# Patient Record
Sex: Female | Born: 1975
Health system: Southern US, Community
[De-identification: ages and names within clinical notes are randomized; demographics above are authoritative.]

## PROBLEM LIST (undated history)

## (undated) DIAGNOSIS — L219 Seborrheic dermatitis, unspecified: Secondary | ICD-10-CM

## (undated) DIAGNOSIS — N6310 Unspecified lump in the right breast, unspecified quadrant: Secondary | ICD-10-CM

## (undated) DIAGNOSIS — I10 Essential (primary) hypertension: Secondary | ICD-10-CM

## (undated) DIAGNOSIS — K219 Gastro-esophageal reflux disease without esophagitis: Secondary | ICD-10-CM

## (undated) DIAGNOSIS — F988 Other specified behavioral and emotional disorders with onset usually occurring in childhood and adolescence: Secondary | ICD-10-CM

## (undated) DIAGNOSIS — E559 Vitamin D deficiency, unspecified: Secondary | ICD-10-CM

## (undated) HISTORY — DX: Gastro-esophageal reflux disease without esophagitis: K21.9

## (undated) HISTORY — DX: Seborrheic dermatitis, unspecified: L21.9

## (undated) HISTORY — PX: TONSILLECTOMY: SUR1361

## (undated) HISTORY — DX: Vitamin D deficiency, unspecified: E55.9

## (undated) HISTORY — DX: Essential (primary) hypertension: I10

## (undated) HISTORY — DX: Other specified behavioral and emotional disorders with onset usually occurring in childhood and adolescence: F98.8

---

## 1998-06-09 ENCOUNTER — Other Ambulatory Visit: Admission: RE | Admit: 1998-06-09 | Discharge: 1998-06-09 | Payer: Self-pay | Admitting: Obstetrics and Gynecology

## 1998-10-21 ENCOUNTER — Emergency Department (HOSPITAL_COMMUNITY): Admission: EM | Admit: 1998-10-21 | Discharge: 1998-10-21 | Payer: Self-pay | Admitting: Emergency Medicine

## 1998-10-22 ENCOUNTER — Inpatient Hospital Stay (HOSPITAL_COMMUNITY): Admission: AD | Admit: 1998-10-22 | Discharge: 1998-10-22 | Payer: Self-pay | Admitting: Obstetrics & Gynecology

## 2001-06-29 ENCOUNTER — Other Ambulatory Visit: Admission: RE | Admit: 2001-06-29 | Discharge: 2001-06-29 | Payer: Self-pay | Admitting: Obstetrics and Gynecology

## 2002-12-18 ENCOUNTER — Other Ambulatory Visit: Admission: RE | Admit: 2002-12-18 | Discharge: 2002-12-18 | Payer: Self-pay | Admitting: Obstetrics and Gynecology

## 2004-04-05 ENCOUNTER — Encounter: Admission: RE | Admit: 2004-04-05 | Discharge: 2004-04-05 | Payer: Self-pay | Admitting: Obstetrics and Gynecology

## 2004-06-24 ENCOUNTER — Other Ambulatory Visit: Admission: RE | Admit: 2004-06-24 | Discharge: 2004-06-24 | Payer: Self-pay | Admitting: Obstetrics and Gynecology

## 2005-07-24 ENCOUNTER — Encounter: Payer: Self-pay | Admitting: Obstetrics and Gynecology

## 2005-07-25 ENCOUNTER — Emergency Department (HOSPITAL_COMMUNITY): Admission: EM | Admit: 2005-07-25 | Discharge: 2005-07-25 | Payer: Self-pay | Admitting: Emergency Medicine

## 2005-12-10 ENCOUNTER — Encounter (INDEPENDENT_AMBULATORY_CARE_PROVIDER_SITE_OTHER): Payer: Self-pay | Admitting: Specialist

## 2005-12-10 ENCOUNTER — Ambulatory Visit (HOSPITAL_COMMUNITY): Admission: RE | Admit: 2005-12-10 | Discharge: 2005-12-10 | Payer: Self-pay | Admitting: Obstetrics and Gynecology

## 2005-12-10 HISTORY — PX: DILATION AND EVACUATION: SHX1459

## 2006-01-20 ENCOUNTER — Ambulatory Visit (HOSPITAL_COMMUNITY): Admission: RE | Admit: 2006-01-20 | Discharge: 2006-01-20 | Payer: Self-pay | Admitting: Obstetrics and Gynecology

## 2006-06-17 ENCOUNTER — Inpatient Hospital Stay (HOSPITAL_COMMUNITY): Admission: AD | Admit: 2006-06-17 | Discharge: 2006-06-17 | Payer: Self-pay | Admitting: Obstetrics and Gynecology

## 2006-10-11 ENCOUNTER — Inpatient Hospital Stay (HOSPITAL_COMMUNITY): Admission: AD | Admit: 2006-10-11 | Discharge: 2006-10-12 | Payer: Self-pay | Admitting: Obstetrics and Gynecology

## 2006-10-14 ENCOUNTER — Inpatient Hospital Stay (HOSPITAL_COMMUNITY): Admission: AD | Admit: 2006-10-14 | Discharge: 2006-10-18 | Payer: Self-pay | Admitting: Obstetrics & Gynecology

## 2006-10-15 ENCOUNTER — Encounter (INDEPENDENT_AMBULATORY_CARE_PROVIDER_SITE_OTHER): Payer: Self-pay | Admitting: Specialist

## 2008-09-16 ENCOUNTER — Inpatient Hospital Stay (HOSPITAL_COMMUNITY): Admission: AD | Admit: 2008-09-16 | Discharge: 2008-09-20 | Payer: Self-pay | Admitting: Obstetrics & Gynecology

## 2008-09-17 ENCOUNTER — Encounter (INDEPENDENT_AMBULATORY_CARE_PROVIDER_SITE_OTHER): Payer: Self-pay | Admitting: Obstetrics and Gynecology

## 2008-09-24 ENCOUNTER — Encounter: Admission: RE | Admit: 2008-09-24 | Discharge: 2008-09-24 | Payer: Self-pay | Admitting: Obstetrics and Gynecology

## 2011-04-19 NOTE — Op Note (Signed)
NAMEJaemarie, Hochberg Judie                  ACCOUNT NO.:  [id number]   MEDICAL RECORD NO.:  [id number]          PATIENT TYPE:  INP   LOCATION:  9127                          FACILITY:  [facility]   PHYSICIAN:  Maxie Better, M.D.DATE OF BIRTH:  [birth year]   DATE OF PROCEDURE:  09/17/2008  DATE OF DISCHARGE:                               OPERATIVE REPORT   PREOPERATIVE DIAGNOSIS:  Arrest of dilatation, previous cesarean section  x2, postterm.   PROCEDURE:  Repeat cesarean section, Kerr hysterotomy.   POSTOPERATIVE DIAGNOSIS:  Arrest of dilatation, previous cesarean  section x2, postterm.   ANESTHESIA:  Epidural.   SURGEON:  Maxie Better, MD   ASSISTANT:  None.   INDICATION:  A 35 year old gravida 4, para 2-0-1-2 female at 50+ weeks  gestation with previous cesarean section x2 with a strong desire for  attempt to trial of labor despite extensive counseling the risk.  The  patient presented in early labor.  The tracing was reactive.  She was  contracting too frequently for Pitocin augmentation or cervical  ripening.  The patient at that time was 2 cm, 50% -3 station.  Artificial rupture of membranes was then performed.  Meconium fluid was  noted.  Internal pressure catheter was placed.  The patient subsequently  necessitated Pitocin augmentation.  She had a low-grade temperature  through the end of her labor course for which, the patient received  Unasyn.  Urine culture was sent.  The patient had a very protracted  labor course.  She arrested at about 8-cm and the patient was advised  that the repeat cesarean section was indicated.  Surgical risks was  reviewed with the patient and her husband.  Consent was signed.  The  patient was quite tearful at this decision but understood no further  management was available with respect to her labor course.  The patient  was transferred to the operating room.   PROCEDURE:  Under adequate epidural anesthesia, the patient was placed  in the  supine position with a left lateral tilt.  An indwelling Foley  catheter was already in place.  The urine was bloody, but clearing from  the actual tubing.  The patient was sterilely prepped and draped in  usual fashion.  Marcaine 0.25% was injected along the previous  Pfannenstiel skin incision site.  Pfannenstiel skin incision was then  made, carried down to the rectus fascia.  The rectus fascia was opened  transversely.  Rectus fascia was then carefully bluntly and sharply  dissected off the rectus muscle in superior-inferior fashion.  The  rectus muscle was carefully opened vertically and opening it superiorly.  The parietal peritoneum was incidentally opened.  Copious fluid was then  noted suggestive of peritoneal fluid; however, concern for bladder  injury was entertained at that time.  With careful dissection of the  parietoperitoneum and the rectus fascia, the rectus muscle was opened  superiorly and it was then noted inferiorly.  The bladder was adherent  to the part of the anterior abdominal wall as well as the uterus.  Careful dissection was performed and the bladder reflection  was opened  up superior to where it appeared to be the scarring.  Uterine incision  was then made and extended with bandage scissors.  Subsequent delivery  of a live female from the left occiput, posterior presentation was  accomplished.  Baby was bulb suctioned and DeLee'd on the abdomen.  The  cord was clamped and cut.  The baby was transferred to the awaiting  pediatrician who assigned Apgars of 9 and 9 at 1 and 5 minutes.  The  placenta which was anterior was manually removed intact and sent to  pathology.  Uterine cavity was cleaned of debris.  Uterus was then  exteriorized.  Inspection particularly where the bladder was notable for  bladder adhesions and the uterine incision had no extension.  It was  closed with a single layer closure with 0 Monocryl running locked stitch  due to the location where  the bladder reflection was.  Additional  individual sutures was placed of 0 Monocryl for hemostasis on the right  side.  Small bleeders along the bladder area was cauterized.  Decision  was then made to assess the bladder integrity due to the question of  whether or not there was denudement of the bladder on the left.  300 mL  of milky fluid was instilled into  the bladder.  The bladder was then  inspected.  No drainage noted and it was then deflated.  The uterus was  then returned back to the abdomen after copious irrigation was then  performed.  Normal tubes and ovaries were noted bilaterally.  Reinspection of the incision showed some small bleeders which were  easily cauterized.  Bladder was once again inspected.  Good hemostasis  noted.  The parietal peritoneum was not closed as it would cause the  bladder to be  high in the field.  The lower portion of the rectus  muscle was approximated with a single suture of 0 Vicryl suture.  The  undersurface of the rectus fascia had a defect, which was individually  closed with 0 Vicryl suture and then the rectus fascia was then  approximated with 0 Vicryl x2.  The subcutaneous area was irrigated,  small bleeders were cauterized.  Interrupted 2-0 plain sutures was  placed in the subcutaneous area and the skin was approximated using  Ethicon staples.   SPECIMENS:  Placenta, sent to pathology.   ESTIMATED BLOOD LOSS:  750 mL.   URINE OUTPUT:  400 mL.   INTRAOPERATIVE FLUID:  1300 mL crystalloid.   Sponge and instrument counts x2 was correct.   COMPLICATION:  None.   Weight of the baby was 9 pounds 1 ounce.  The patient tolerated the  procedure well and was transferred to recovery in stable condition.       Maxie Better, M.D.  Electronically Signed     [place]/MEDQ  D:  09/17/2008  T:  09/18/2008  Job:  [id number]

## 2011-04-22 NOTE — Op Note (Signed)
NAMETerry, [name]                  ACCOUNT NO.:  [id number]   MEDICAL RECORD NO.:  [id number]          PATIENT TYPE:  INP   LOCATION:  9139                          FACILITY:  [facility]   PHYSICIAN:  Maxie Better, M.D.DATE OF BIRTH:  11/17/1976   DATE OF PROCEDURE:  10/15/2006  DATE OF DISCHARGE:                               OPERATIVE REPORT   PREOPERATIVE DIAGNOSES:  1. Arrest of dilatation.  2. Failed attempted vaginal delivery status post cesarean section.  3. Term gestation.   PROCEDURE:  Repeat cesarean section, Kerr hysterotomy.   POSTOPERATIVE DIAGNOSES:  1. Arrest of dilatation.  2. Failed vaginal birth after cesarean section.  3. Term gestation.  4. Left occiput posterior presentation.   ANESTHESIA:  Epidural.   SURGEON:  Maxie Better, M.D.   ASSISTANT:  Marlinda Mike, C.N.M.   PROCEDURE:  Under adequate epidural anesthesia, the patient was placed  in the supine position with a left lateral tilt.  She was sterilely  prepped and draped in the usual fashion.  An indwelling Foley catheter  was already in place.  0.25%  percent Marcaine was injected along the  previous Pfannenstiel skin incision.   A Pfannenstiel skin incision was then made and carried down to the  rectus fascia.  The rectus fascia was opened transversely.  The rectus  fascia was then carefully sharply dissected off the rectus muscle in  superior and inferior fashion.  The rectus muscle was split in midline.  The parietal peritoneum was entered and extended.  On entering the  abdominal cavity, bladder adhesions were noted.  Careful dissection  transversely resulted in those adhesions being released.  The  vesicouterine peritoneum was then opened transversely.  The bladder was  then bluntly dissected off the lower uterine segment and placed  inferiorly.  There was a limited amount of descent on the bladder due to  the prior surgery.  A curvilinear low transverse uterine incision was  then made  and extended bilaterally using bandage scissors.  Copious  clear fluid was noted.  The baby was in the left occiput posterior  presentation.  Attempt at initial delivery was unsuccessful.  Therefore,  a vacuum was used for assistance in the delivery, with subsequent  delivery of a live female who was bulb suctioned on the abdomen.  The  cord was clamped, cut, and the baby was transferred to the awaiting  pediatricians who assigned Apgars of 9 and 9 at 1 and 5 minutes.  The  placenta which was posterior was manually removed and sent to pathology.  The uterine cavity was cleaned of debris.  The uterine incision had no  extension.  The bladder was reinspected, and the lower uterine segment  inferiorly and additional dissection of the bladder off the lower  uterine segment was performed and noted to facilitate closure of the  incision better.  The uterine incision was then closed in two layers,  the first layer of 0 Monocryl running locked stitch and the second layer  was imbricated using 0 Monocryl suture.  Normal tubes and ovaries were  noted bilaterally.  The  abdomen was copiously irrigated and suctioned of  debris.  The paracolic gutters were cleaned of debris.  The  vesicouterine peritoneum was not closed.  The parietal peritoneum was  not closed in deference to the bladder being high on the uterus and  therefore concern for pulling it up into the anterior abdominal wall.  The undersurface of the rectus fascia was inspected.  Small bleeders  were cauterized.  The rectus fascia was closed with 0 Vicryl x2.  The  subcutaneous areas were irrigated.  Small bleeders were cauterized, and  a 2-0 plain interrupted suture was then placed.  The skin was  approximated using Ethicon staples.   SPECIMEN:  Placenta sent to pathology.   ESTIMATED BLOOD LOSS:  700 mL.   INTRAOPERATIVE FLUID:  1500 mL crystalloid.   URINE OUTPUT:  30 mL clear yellow urine.   COUNTS:  Sponge and instrument counts x2  was correct.   COMPLICATIONS:  None.   The weight of the baby was 8 pounds.  The patient was transferred to the  recovery in stable condition.      Maxie Better, M.D.  Electronically Signed     [place]/MEDQ  D:  10/15/2006  T:  10/15/2006  Job:  [id number]

## 2011-04-22 NOTE — Discharge Summary (Signed)
NAMEAusha, [name]                  ACCOUNT NO.:  [id number]   MEDICAL RECORD NO.:  [id number]          PATIENT TYPE:  INP   LOCATION:  9139                          [facility]ACILITY:  [facility]   PHYSICIAN:  Maxie Better, M.D.DATE OF BIRTH:  December 18, 1975   DATE OF ADMISSION:  10/14/2006  DATE OF DISCHARGE:  10/18/2006                               DISCHARGE SUMMARY   ADMISSION DIAGNOSES:  1. Previous cesarean section.  2. Early Labor.  3. Intrauterine gestation at 39-3/7 weeks.   DISCHARGE DIAGNOSES:  1. Term gestation, delivered.  2. Failed trial of labor.  3. Status post repeat cesarean section.  4. Arrest of dilatation.  5. Left occipitoposterior fetal presentation.  6. Postoperative anemia.   HISTORY OF PRESENT ILLNESS:  This is a 35 year old gravida 2, para 1  female with a previous cesarean section for arrest of descent, who  desires attempt at vaginal delivery.  The patient had an uncomplicated  prenatal course.  She presented in labor.   HOSPITAL COURSE:  The patient presented in labor.  She was admitted.  At  that time, her tracing was reactive.  She was contracting every 2-4  minutes.  Her cervical exam was 3, 80%, vertex presentation.  Group B  strep was negative.  The patient subsequently underwent artificial  rupture of membranes; clear fluid was noted.  She had irregular  contractions; Pitocin augmentation was continued.  The patient remained  at 4 cm despite continuing to increase her Pitocin.  With adequate  documented intrauterine contractile strength and the cervix remained  unchanged, the patient was given the option of continuing versus a  repeat C-section.  The patient developed an axillary temperature of  99.1; urine culture was sent.  The patient subsequently decided on  proceeding with a repeat cesarean section after the cervix still  remained unchanged and she was taken to the operating room, where she  underwent a repeat cesarean section per hysterotomy;  please see the  dictated operative note for the specific details.  The procedure  resulted in an 8-pound live female from the left occipitoposterior  presentation, Apgars 9 and 9.  The patient had an uncomplicated  postoperative course.  Her CBC on postop day #1 showed a hemoglobin of  8.9, hematocrit 25.7, white count of 10.1.  Her urine culture was  negative.  By postop day #3, the patient was tolerating a regular diet.  She had remained afebrile.  Incision showed no evidence of infection.  She was doing well to be discharged home.   DISPOSITION:  Home.   CONDITION:  Stable.   DISCHARGE MEDICATIONS:  1. Motrin 600 mg one p.o. q.6 h. p.r.n. pain.  2. Percocet one to two tablets every 3-4 hours p.r.n. pain.  3. Chromagen Forte one p.o. daily.  4. Continue her prenatal vitamins.   FOLLOWUP APPOINTMENT:  At [facility] in 6 weeks.   DISCHARGE INSTRUCTIONS:  Per the postpartum booklet given.      Maxie Better, M.D.  Electronically Signed     [place]/MEDQ  D:  11/08/2006  T:  11/08/2006  Job:  [id number] 

## 2011-04-22 NOTE — Discharge Summary (Signed)
NAMEShanique, [name]                  ACCOUNT NO.:  [id number]   MEDICAL RECORD NO.:  [id number]          PATIENT TYPE:  INP   LOCATION:  9127                          [facility]   PHYSICIAN:  Maxie Better, M.D.DATE OF BIRTH:  March 05, 1976   DATE OF ADMISSION:  09/16/2008  DATE OF DISCHARGE:  09/20/2008                               DISCHARGE SUMMARY   ADMISSION DIAGNOSES:  1. Previous cesarean section x2.  2. Early labor.   DISCHARGE DIAGNOSES:  1. Previous cesarean section x2.  2. Failed attempted vaginal delivery.  3. Arrest of dilatation.   PROCEDURE:  Repeat cesarean section.   HISTORY OF PRESENT ILLNESS:  A 35 year old gravida 4, para 2-0-1-2 at 16  plus weeks gestation with a previous low transverse cesarean section x2,  who was strongly desired to attempt a vaginal delivery.  Estimated fetal  weight on September 04, 2008 was 7 pounds 11 ounces.  The prenatal course  had been notable for polyhydramnios, which resolved at 37 weeks.  Her  glucose tolerance test was normal.   HOSPITAL COURSE:  The patient was admitted.  Her cervical exam was 250  and -3 vertex.  She had a reactive tracing and her group B strep was  negative.  She admitted to the labor and delivery.  She had artificial  rupture of membranes and she was 360, -3, -2 station.  Medium meconium  noted.  Intrauterine pressure catheter was placed.  Amnioinfusion was  planned.  The contractions every 2-3 minutes were not strong.  Pitocin  was continued.  The patient had dysfunctional labor pattern.  A  protracted arrest of dilatation.  Unasyn was started for prolonged  rupture of membranes.  The patient progressed to 8 cm, 90%, -1 and  arrested at that dilatation.  Decision was made to proceed for repeat  cesarean section.  Repeat C-section was done.  A live female 9 pounds 1  ounce left occiput posterior presentation, Apgars of 9 and 9.  Normal  tubes and ovaries.  The patient had an uncomplicated postoperative  course.  The baby was transferred and went to the NICU for question of  seizure activity.  The patient states incision had no evidence of  infection.  Her placenta had no evidence of infection on pathology.  She  was deemed well to be discharged home.  Her CBC on postop day #1 showed  hemoglobin 9.2, hematocrit 28.4, white count of 12.3, and platelet count  198,000.  Urine culture was negative.  Postop day #3, feeling well.  Incision without evidence of infection.   DISPOSITION:  Home.   CONDITION:  Stable.   DISCHARGE MEDICATIONS:  1. Motrin 600-800 mg every 6-8 hours p.r.n. pain.  2. Prenatal vitamins 1 p.o. daily.  3. Percocet 1-2 tablets every 4-6 hours p.r.n. pain.   FOLLOWUP APPOINTMENTS:  [facility] in 6 weeks and for staple  removal at the office in 7 days postop.   DISCHARGE INSTRUCTIONS:  Per the postpartum booklet given.       Maxie Better, M.D.  Electronically Signed     [place]/MEDQ  D:  10/14/2008  T:  10/15/2008  Job:  [id number]

## 2011-04-22 NOTE — Op Note (Signed)
NAMEVoula Douglas, [name]                  ACCOUNT NO.:  [id number]   MEDICAL RECORD NO.:  [id number]          PATIENT TYPE:  AMB   LOCATION:  SDC                           [facility]ILITY:  [facility]   PHYSICIAN:  Maxie Better, M.D.DATE OF BIRTH:  1976-01-14   DATE OF PROCEDURE:  DATE OF DISCHARGE:                                 OPERATIVE REPORT   PREOPERATIVE DIAGNOSIS:  Blighted ovum.   OPERATION/PROCEDURE:  Suction dilation and evacuation.   POSTOPERATIVE DIAGNOSIS:  Blighted ovum.   ANESTHESIA:  MAC, paracervical block.   SURGEON:  Maxie Better, M.D.   INDICATIONS:  A 35 year old gravida 2, para 1 female at 9+ weeks gestation,  was found to have a blighted ovum and ultrasound with size less than dates  on December 09, 2005 who now presents for surgical management.  The risks and  benefits of the procedure has been explained to the patient and her husband.  Consent was signed. The patient was transferred to the operating room.   DESCRIPTION OF PROCEDURE:  Under adequate monitored anesthesia, the patient  was placed in the dorsal lithotomy position.  She was sterilely prepped and  draped in the usual fashion.  The bladder was catheterized of a small amount  of urine.  Examination under anesthesia revealed an anteverted uterus about  eight week size.  No adnexal masses could be appreciated.  A bivalve  speculum was placed in the vagina and 20 mL of 1% Nesacaine was injected  paracervically at 3 and 9 o'clock.  The anterior lip of the cervix grasped  with a single-tooth tenaculum.  The cervix was serially dilated up to #31  [facility] dilator.  A #8 mm curved suction cannula was introduced into the  uterine cavity.  A moderate amount of tissue was obtained.  The cavity was  then curetted at which time there was a question of possible septation in  the fundal region of the uterus. Nonetheless, the cavity was curetted,  suctioned and curetted until all tissue was felt to have been removed at  which time all instruments were then removed from the vagina.  Specimen  labeled products of conception was sent to pathology.  Estimated blood loss  was minimal.  Complications were none.  Maternal blood type was A positive.  The patient tolerated the procedure well, was transferred to the recovery  room in stable condition.      Maxie Better, M.D.  Electronically Signed     [place]/MEDQ  D:  12/10/2005  T:  12/11/2005  Job:  [id number]

## 2011-09-05 LAB — CBC
HCT: 28.4 — ABNORMAL LOW
HCT: 33.8 — ABNORMAL LOW
Hemoglobin: 11.1 — ABNORMAL LOW
Hemoglobin: 9.2 — ABNORMAL LOW
MCV: 85.2
RBC: 3.33 — ABNORMAL LOW
RBC: 3.99
WBC: 10.8 — ABNORMAL HIGH
WBC: 12.3 — ABNORMAL HIGH

## 2011-09-05 LAB — RPR: RPR Ser Ql: NONREACTIVE

## 2011-09-05 LAB — URINE CULTURE: Culture: NO GROWTH

## 2011-09-05 LAB — CARDIAC PANEL(CRET KIN+CKTOT+MB+TROPI)
CK, MB: 1.6
Total CK: 52
Troponin I: 0.01

## 2016-02-22 MED FILL — OSELTAMIVIR PHOS 75 MG CAP: 75 | 10 days supply | Qty: 10 | Fill #0

## 2016-06-09 DIAGNOSIS — Z136 Encounter for screening for cardiovascular disorders: Secondary | ICD-10-CM | POA: Diagnosis not present

## 2016-06-09 DIAGNOSIS — Z13220 Encounter for screening for lipoid disorders: Secondary | ICD-10-CM | POA: Diagnosis not present

## 2016-06-09 DIAGNOSIS — M67442 Ganglion, left hand: Secondary | ICD-10-CM | POA: Diagnosis not present

## 2016-06-09 DIAGNOSIS — Z7689 Persons encountering health services in other specified circumstances: Secondary | ICD-10-CM | POA: Diagnosis not present

## 2016-06-09 DIAGNOSIS — Z130 Encounter for screening for diseases of the blood and blood-forming organs and certain disorders involving the immune mechanism: Secondary | ICD-10-CM | POA: Diagnosis not present

## 2016-06-09 DIAGNOSIS — Z6832 Body mass index (BMI) 32.0-32.9, adult: Secondary | ICD-10-CM | POA: Diagnosis not present

## 2016-06-09 DIAGNOSIS — Z131 Encounter for screening for diabetes mellitus: Secondary | ICD-10-CM | POA: Diagnosis not present

## 2016-06-09 DIAGNOSIS — E663 Overweight: Secondary | ICD-10-CM | POA: Diagnosis not present

## 2016-07-14 DIAGNOSIS — M67442 Ganglion, left hand: Secondary | ICD-10-CM | POA: Diagnosis not present

## 2016-11-02 DIAGNOSIS — Z803 Family history of malignant neoplasm of breast: Secondary | ICD-10-CM | POA: Diagnosis not present

## 2016-11-02 DIAGNOSIS — Z1231 Encounter for screening mammogram for malignant neoplasm of breast: Secondary | ICD-10-CM | POA: Diagnosis not present

## 2017-11-03 DIAGNOSIS — Z01419 Encounter for gynecological examination (general) (routine) without abnormal findings: Secondary | ICD-10-CM | POA: Diagnosis not present

## 2017-11-03 DIAGNOSIS — Z6834 Body mass index (BMI) 34.0-34.9, adult: Secondary | ICD-10-CM | POA: Diagnosis not present

## 2017-11-10 DIAGNOSIS — Z130 Encounter for screening for diseases of the blood and blood-forming organs and certain disorders involving the immune mechanism: Secondary | ICD-10-CM | POA: Diagnosis not present

## 2017-11-10 DIAGNOSIS — Z1151 Encounter for screening for human papillomavirus (HPV): Secondary | ICD-10-CM | POA: Diagnosis not present

## 2017-11-10 DIAGNOSIS — Z131 Encounter for screening for diabetes mellitus: Secondary | ICD-10-CM | POA: Diagnosis not present

## 2017-11-10 DIAGNOSIS — Z0000 Encounter for general adult medical examination without abnormal findings: Secondary | ICD-10-CM | POA: Diagnosis not present

## 2017-11-10 DIAGNOSIS — Z1329 Encounter for screening for other suspected endocrine disorder: Secondary | ICD-10-CM | POA: Diagnosis not present

## 2017-11-10 DIAGNOSIS — Z13220 Encounter for screening for lipoid disorders: Secondary | ICD-10-CM | POA: Diagnosis not present

## 2017-11-10 DIAGNOSIS — Z124 Encounter for screening for malignant neoplasm of cervix: Secondary | ICD-10-CM | POA: Diagnosis not present

## 2018-01-01 ENCOUNTER — Other Ambulatory Visit: Payer: Self-pay | Admitting: Radiology

## 2018-01-22 ENCOUNTER — Ambulatory Visit: Payer: Self-pay | Admitting: Surgery

## 2018-01-22 DIAGNOSIS — N6310 Unspecified lump in the right breast, unspecified quadrant: Secondary | ICD-10-CM

## 2018-01-22 NOTE — H&P (Signed)
[name] Documented: 01/22/2018 11:31 AM Location: [facility] Patient #: [id number] DOB: 1976-07-22 Married / Language: English / Race: Black or African American Female  History of Present Illness Marcello Moores A. Amelie Caracci MD; 01/22/2018 12:47 PM) Patient words: breast mass   Patient sent at the request of Dr. Luan Pulling from [facility] for mammographic abnormality of right breast. She went for screening mammogram and subsequent diagnostic mammogram with ultrasound and core biopsy of an area in her right breast upper outer quadrant. The pathology has not been finalized and was sent out for a third opinion. 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 I do not have the report back and have requested this from pathology. Patient has a family history of breast cancer both on the maternal and paternal side. Patient noticed some fullness in her right breast in the upper outer quadrant which prompted the mammogram this time which was about a month and a half ago. The area is now gone. She denies any nipple discharge or problem with either breast.  The patient is a 42 year old female.   Past Surgical History Sharyn Lull R. Brooks, CMA; 01/22/2018 11:31 AM) Breast Biopsy Right. Cesarean Section - Multiple Tonsillectomy  Diagnostic Studies History Sharyn Lull R. Rolena Infante, CMA; 01/22/2018 11:31 AM) Colonoscopy never Mammogram within last year Pap Smear 1-5 years ago  Allergies Sharyn Lull R. Brooks, CMA; 01/22/2018 11:31 AM) No Known Drug Allergies [01/22/2018]:  Medication History Sharyn Lull R. Brooks, CMA; 01/22/2018 11:31 AM) No Current Medications Medications Reconciled  Social History Sharyn Lull R. Brooks, CMA; 01/22/2018 11:31 AM) Alcohol use Occasional alcohol use. Caffeine use Carbonated beverages, Coffee. No drug use Tobacco use Never smoker.  Family History Sharyn Lull R. Rolena Infante, CMA; 01/22/2018 11:31 AM) Alcohol Abuse Father. Breast Cancer Mother. Diabetes Mellitus  Mother. Heart disease in female family member before age 61 Hypertension Mother.  Pregnancy / Birth History Sharyn Lull R. Rolena Infante, CMA; 01/22/2018 11:31 AM) Age at menarche 18 years. Contraceptive History Intrauterine device. Gravida 4 Length (months) of breastfeeding 12-24 Maternal age 57-20 Para 3 Regular periods  Other Problems Sharyn Lull R. Brooks, CMA; 01/22/2018 11:31 AM) Back Pain     Review of Systems [facility] R. Brooks CMA; 01/22/2018 11:31 AM) General Not Present- Appetite Loss, Chills, Fatigue, Fever, Night Sweats, Weight Gain and Weight Loss. Skin Not Present- Change in Wart/Mole, Dryness, Hives, Jaundice, New Lesions, Non-Healing Wounds, Rash and Ulcer. HEENT Not Present- Earache, Hearing Loss, Hoarseness, Nose Bleed, Oral Ulcers, Ringing in the Ears, Seasonal Allergies, Sinus Pain, Sore Throat, Visual Disturbances, Wears glasses/contact lenses and Yellow Eyes. Respiratory Not Present- Bloody sputum, Chronic Cough, Difficulty Breathing, Snoring and Wheezing. Breast Not Present- Breast Mass, Breast Pain, Nipple Discharge and Skin Changes. Cardiovascular Not Present- Chest Pain, Difficulty Breathing Lying Down, Leg Cramps, Palpitations, Rapid Heart Rate, Shortness of Breath and Swelling of Extremities. Gastrointestinal Not Present- Abdominal Pain, Bloating, Bloody Stool, Change in Bowel Habits, Chronic diarrhea, Constipation, Difficulty Swallowing, Excessive gas, Gets full quickly at meals, Hemorrhoids, Indigestion, Nausea, Rectal Pain and Vomiting. Female Genitourinary Not Present- Frequency, Nocturia, Painful Urination, Pelvic Pain and Urgency. Musculoskeletal Not Present- Back Pain, Joint Pain, Joint Stiffness, Muscle Pain, Muscle Weakness and Swelling of Extremities. Neurological Not Present- Decreased Memory, Fainting, Headaches, Numbness, Seizures, Tingling, Tremor, Trouble walking and Weakness. Psychiatric Not Present- Anxiety, Bipolar, Change in Sleep Pattern,  Depression, Fearful and Frequent crying. Endocrine Not Present- Cold Intolerance, Excessive Hunger, Hair Changes, Heat Intolerance, Hot flashes and New Diabetes. Hematology Not Present- Blood Thinners, Easy Bruising, Excessive bleeding, Gland problems,  HIV and Persistent Infections.  Vitals [employer] R. Brooks CMA; 01/22/2018 11:31 AM) 01/22/2018 11:31 AM Weight: 183.38 lb Height: 61in Body Surface Area: 1.82 m Body Mass Index: 34.65 kg/m  BP: 150/86 (Sitting, Left Arm, Standard)      Physical Exam (Pheng Prokop A. Taleisha Kaczynski MD; 01/22/2018 12:48 PM)  General Mental Status-Alert. General Appearance-Consistent with stated age. Hydration-Well hydrated. Voice-Normal.  Head and Neck Head-normocephalic, atraumatic with no lesions or palpable masses. Trachea-midline. Thyroid Gland Characteristics - normal size and consistency.  Eye Eyeball - Bilateral-Extraocular movements intact. Sclera/Conjunctiva - Bilateral-No scleral icterus.  Chest and Lung Exam Note: NO WHEEZING wob NORMAL  Breast Breast - Left-Symmetric, Non Tender, No Biopsy scars, no Dimpling, No Inflammation, No Lumpectomy scars, No Mastectomy scars, No Peau d' Orange. Breast - Right-Symmetric, Non Tender, No Biopsy scars, no Dimpling, No Inflammation, No Lumpectomy scars, No Mastectomy scars, No Peau d' Orange. Breast Lump-No Palpable Breast Mass.  Cardiovascular Note: SR  Neurologic Neurologic evaluation reveals -alert and oriented x 3 with no impairment of recent or remote memory. Mental Status-Normal.  Lymphatic Head & Neck  General Head & Neck Lymphatics: Bilateral - Description - Normal. Axillary  General Axillary Region: Bilateral - Description - Normal. Tenderness - Non Tender.    Assessment & Plan (Ezinne Yogi A. Isabeau Mccalla MD; 01/22/2018 12:48 PM)  BREAST MASS, RIGHT (N63.10) Impression: Risk of lumpectomy include bleeding, infection, seroma, more surgery, use of seed/wire,  wound care, cosmetic deformity and the need for other treatments, death , blood clots, death. Pt agrees to proceed.  Current Plans Pt Education - [facility] Breast Biopsy HCI: discussed with patient and provided information. The anatomy and the physiology was discussed. The pathophysiology and natural history of the disease was discussed. Options were discussed and recommendations were made. Technique, risks, benefits, & alternatives were discussed. Risks such as stroke, heart attack, bleeding, indection, death, and other risks discussed. Questions answered. The patient agrees to proceed.

## 2018-01-23 ENCOUNTER — Encounter (HOSPITAL_COMMUNITY): Payer: Self-pay

## 2018-03-16 MED FILL — FLUCONAZOLE 150 MG TABLET: 150 | 1 days supply | Qty: 1 | Fill #0

## 2018-04-04 DIAGNOSIS — N6310 Unspecified lump in the right breast, unspecified quadrant: Secondary | ICD-10-CM

## 2018-04-04 HISTORY — DX: Unspecified lump in the right breast, unspecified quadrant: N63.10

## 2018-04-10 ENCOUNTER — Other Ambulatory Visit: Payer: Self-pay

## 2018-04-10 ENCOUNTER — Encounter (HOSPITAL_BASED_OUTPATIENT_CLINIC_OR_DEPARTMENT_OTHER): Payer: Self-pay | Admitting: *Deleted

## 2018-04-10 NOTE — Pre-Procedure Instructions (Signed)
To come pick up Ensure pre-surgery drink 10 oz. - to drink by 0500 DOS.

## 2018-04-23 NOTE — Progress Notes (Signed)
Ensure pre surgery drink given with instructions to complete by 0500 dos, pt verbalized understanding. 

## 2018-04-24 ENCOUNTER — Ambulatory Visit: Payer: Self-pay | Admitting: Surgery

## 2018-04-24 DIAGNOSIS — N6310 Unspecified lump in the right breast, unspecified quadrant: Secondary | ICD-10-CM

## 2018-04-25 ENCOUNTER — Ambulatory Visit (HOSPITAL_BASED_OUTPATIENT_CLINIC_OR_DEPARTMENT_OTHER)
Admission: RE | Admit: 2018-04-25 | Discharge: 2018-04-25 | Disposition: A | Discharge status: Home / Self Care | Payer: No Typology Code available for payment source | Source: Ambulatory Visit | Attending: Surgery | Admitting: Surgery

## 2018-04-25 ENCOUNTER — Encounter (HOSPITAL_BASED_OUTPATIENT_CLINIC_OR_DEPARTMENT_OTHER): Payer: Self-pay | Admitting: *Deleted

## 2018-04-25 ENCOUNTER — Ambulatory Visit (HOSPITAL_BASED_OUTPATIENT_CLINIC_OR_DEPARTMENT_OTHER): Payer: No Typology Code available for payment source | Admitting: Anesthesiology

## 2018-04-25 ENCOUNTER — Other Ambulatory Visit: Payer: Self-pay

## 2018-04-25 ENCOUNTER — Encounter (HOSPITAL_BASED_OUTPATIENT_CLINIC_OR_DEPARTMENT_OTHER)
Admission: RE | Disposition: A | Discharge status: Home / Self Care | Payer: Self-pay | Source: Ambulatory Visit | Attending: Surgery

## 2018-04-25 DIAGNOSIS — Z833 Family history of diabetes mellitus: Secondary | ICD-10-CM | POA: Insufficient documentation

## 2018-04-25 DIAGNOSIS — N6310 Unspecified lump in the right breast, unspecified quadrant: Secondary | ICD-10-CM | POA: Diagnosis present

## 2018-04-25 DIAGNOSIS — D241 Benign neoplasm of right breast: Secondary | ICD-10-CM | POA: Insufficient documentation

## 2018-04-25 DIAGNOSIS — Z811 Family history of alcohol abuse and dependence: Secondary | ICD-10-CM | POA: Insufficient documentation

## 2018-04-25 DIAGNOSIS — M549 Dorsalgia, unspecified: Secondary | ICD-10-CM | POA: Diagnosis not present

## 2018-04-25 DIAGNOSIS — E669 Obesity, unspecified: Secondary | ICD-10-CM | POA: Insufficient documentation

## 2018-04-25 DIAGNOSIS — N6091 Unspecified benign mammary dysplasia of right breast: Secondary | ICD-10-CM | POA: Insufficient documentation

## 2018-04-25 DIAGNOSIS — Z8249 Family history of ischemic heart disease and other diseases of the circulatory system: Secondary | ICD-10-CM | POA: Insufficient documentation

## 2018-04-25 DIAGNOSIS — Z803 Family history of malignant neoplasm of breast: Secondary | ICD-10-CM | POA: Diagnosis not present

## 2018-04-25 DIAGNOSIS — Z6834 Body mass index (BMI) 34.0-34.9, adult: Secondary | ICD-10-CM | POA: Diagnosis not present

## 2018-04-25 HISTORY — DX: Unspecified lump in the right breast, unspecified quadrant: N63.10

## 2018-04-25 HISTORY — PX: BREAST LUMPECTOMY WITH RADIOACTIVE SEED LOCALIZATION: SHX6424

## 2018-04-25 SURGERY — BREAST LUMPECTOMY WITH RADIOACTIVE SEED LOCALIZATION
Anesthesia: General | Site: Breast | Laterality: Right

## 2018-04-25 MED ORDER — CHLORHEXIDINE GLUCONATE CLOTH 2 % EX PADS: 6.0000 | MEDICATED_PAD | Freq: Once | CUTANEOUS | Status: DC

## 2018-04-25 MED ORDER — GABAPENTIN 300 MG PO CAPS
ORAL_CAPSULE | ORAL | Status: AC
  Filled 2018-04-25: qty 1

## 2018-04-25 MED ORDER — MEPERIDINE HCL 25 MG/ML IJ SOLN: 6.2500 mg | INTRAMUSCULAR | Status: DC | PRN

## 2018-04-25 MED ORDER — BUPIVACAINE-EPINEPHRINE (PF) 0.25% -1:200000 IJ SOLN
INTRAMUSCULAR | Status: DC | PRN
  Administered 2018-04-25: 9 mL via PERINEURAL

## 2018-04-25 MED ORDER — ONDANSETRON HCL 4 MG/2ML IJ SOLN
INTRAMUSCULAR | Status: DC | PRN
  Administered 2018-04-25: 4 mg via INTRAVENOUS

## 2018-04-25 MED ORDER — FENTANYL CITRATE (PF) 100 MCG/2ML IJ SOLN: 25.0000 ug | INTRAMUSCULAR | Status: DC | PRN

## 2018-04-25 MED ORDER — GABAPENTIN 300 MG PO CAPS
300.0000 mg | ORAL_CAPSULE | ORAL | Status: AC
  Administered 2018-04-25: 300 mg via ORAL

## 2018-04-25 MED ORDER — DEXTROSE 5 % IV SOLN: 3.0000 g | INTRAVENOUS | Status: DC

## 2018-04-25 MED ORDER — IBUPROFEN 800 MG PO TABS: 800.0000 mg | ORAL_TABLET | Freq: Three times a day (TID) | ORAL | 0 refills | Status: DC | PRN

## 2018-04-25 MED ORDER — BUPIVACAINE-EPINEPHRINE (PF) 0.25% -1:200000 IJ SOLN
INTRAMUSCULAR | Status: AC
  Filled 2018-04-25: qty 30

## 2018-04-25 MED ORDER — LIDOCAINE HCL (CARDIAC) PF 100 MG/5ML IV SOSY
PREFILLED_SYRINGE | INTRAVENOUS | Status: AC
  Filled 2018-04-25: qty 5

## 2018-04-25 MED ORDER — LACTATED RINGERS IV SOLN
INTRAVENOUS | Status: DC
  Administered 2018-04-25 (×2): via INTRAVENOUS

## 2018-04-25 MED ORDER — MIDAZOLAM HCL 2 MG/2ML IJ SOLN
INTRAMUSCULAR | Status: AC
  Filled 2018-04-25: qty 2

## 2018-04-25 MED ORDER — SCOPOLAMINE 1 MG/3DAYS TD PT72
1.0000 | MEDICATED_PATCH | Freq: Once | TRANSDERMAL | Status: DC | PRN
Start: 2018-04-25 — End: 2018-04-25
  Administered 2018-04-25: 1.5 mg via TRANSDERMAL

## 2018-04-25 MED ORDER — METOCLOPRAMIDE HCL 5 MG/ML IJ SOLN
INTRAMUSCULAR | Status: AC
  Filled 2018-04-25: qty 2

## 2018-04-25 MED ORDER — FENTANYL CITRATE (PF) 100 MCG/2ML IJ SOLN
INTRAMUSCULAR | Status: AC
  Filled 2018-04-25: qty 2

## 2018-04-25 MED ORDER — HYDROCODONE-ACETAMINOPHEN 7.5-325 MG PO TABS: 1.0000 | ORAL_TABLET | Freq: Once | ORAL | Status: DC | PRN

## 2018-04-25 MED ORDER — MIDAZOLAM HCL 2 MG/2ML IJ SOLN
1.0000 mg | INTRAMUSCULAR | Status: DC | PRN
  Administered 2018-04-25: 2 mg via INTRAVENOUS

## 2018-04-25 MED ORDER — DEXAMETHASONE SODIUM PHOSPHATE 4 MG/ML IJ SOLN
INTRAMUSCULAR | Status: DC | PRN
  Administered 2018-04-25: 10 mg via INTRAVENOUS

## 2018-04-25 MED ORDER — CELECOXIB 200 MG PO CAPS
ORAL_CAPSULE | ORAL | Status: AC
  Filled 2018-04-25: qty 1

## 2018-04-25 MED ORDER — OXYCODONE HCL 5 MG PO TABS: 5.0000 mg | ORAL_TABLET | Freq: Four times a day (QID) | ORAL | 0 refills | Status: DC | PRN

## 2018-04-25 MED ORDER — CEFAZOLIN SODIUM-DEXTROSE 2-4 GM/100ML-% IV SOLN
INTRAVENOUS | Status: AC
  Filled 2018-04-25: qty 100

## 2018-04-25 MED ORDER — SCOPOLAMINE 1 MG/3DAYS TD PT72
MEDICATED_PATCH | TRANSDERMAL | Status: AC
  Filled 2018-04-25: qty 1

## 2018-04-25 MED ORDER — LIDOCAINE HCL (CARDIAC) PF 100 MG/5ML IV SOSY
PREFILLED_SYRINGE | INTRAVENOUS | Status: DC | PRN
  Administered 2018-04-25: 50 mg via INTRAVENOUS

## 2018-04-25 MED ORDER — DEXAMETHASONE SODIUM PHOSPHATE 10 MG/ML IJ SOLN
INTRAMUSCULAR | Status: AC
  Filled 2018-04-25: qty 1

## 2018-04-25 MED ORDER — CELECOXIB 200 MG PO CAPS
200.0000 mg | ORAL_CAPSULE | ORAL | Status: AC
  Administered 2018-04-25: 200 mg via ORAL

## 2018-04-25 MED ORDER — PHENYLEPHRINE 40 MCG/ML (10ML) SYRINGE FOR IV PUSH (FOR BLOOD PRESSURE SUPPORT)
PREFILLED_SYRINGE | INTRAVENOUS | Status: AC
  Filled 2018-04-25: qty 10

## 2018-04-25 MED ORDER — FENTANYL CITRATE (PF) 100 MCG/2ML IJ SOLN
50.0000 ug | INTRAMUSCULAR | Status: DC | PRN
  Administered 2018-04-25 (×2): 50 ug via INTRAVENOUS

## 2018-04-25 MED ORDER — ACETAMINOPHEN 500 MG PO TABS
ORAL_TABLET | ORAL | Status: AC
  Filled 2018-04-25: qty 2

## 2018-04-25 MED ORDER — ACETAMINOPHEN 500 MG PO TABS
1000.0000 mg | ORAL_TABLET | ORAL | Status: AC
  Administered 2018-04-25: 1000 mg via ORAL

## 2018-04-25 MED ORDER — METOCLOPRAMIDE HCL 5 MG/ML IJ SOLN
10.0000 mg | Freq: Once | INTRAMUSCULAR | Status: AC | PRN
  Administered 2018-04-25: 10 mg via INTRAVENOUS

## 2018-04-25 MED ORDER — SUCCINYLCHOLINE CHLORIDE 200 MG/10ML IV SOSY
PREFILLED_SYRINGE | INTRAVENOUS | Status: AC
Start: 2018-04-25 — End: ?
  Filled 2018-04-25: qty 10

## 2018-04-25 MED ORDER — EPHEDRINE SULFATE 50 MG/ML IJ SOLN
INTRAMUSCULAR | Status: AC
  Filled 2018-04-25: qty 1

## 2018-04-25 MED ORDER — PROPOFOL 500 MG/50ML IV EMUL
INTRAVENOUS | Status: AC
  Filled 2018-04-25: qty 50

## 2018-04-25 MED ORDER — ONDANSETRON HCL 4 MG/2ML IJ SOLN
INTRAMUSCULAR | Status: AC
Start: 2018-04-25 — End: ?
  Filled 2018-04-25: qty 2

## 2018-04-25 MED ORDER — PROPOFOL 10 MG/ML IV BOLUS
INTRAVENOUS | Status: DC | PRN
  Administered 2018-04-25: 150 mg via INTRAVENOUS

## 2018-04-25 MED ORDER — CEFAZOLIN SODIUM-DEXTROSE 2-4 GM/100ML-% IV SOLN
2.0000 g | INTRAVENOUS | Status: AC
  Administered 2018-04-25: 2 g via INTRAVENOUS

## 2018-04-25 MED FILL — oxyCODONE HCL 5 MG TABS: 5 | 3 days supply | Qty: 10 | Fill #0

## 2018-04-25 MED FILL — IBUPROFEN 800 MG TAB: 800 | 10 days supply | Qty: 30 | Fill #0

## 2018-04-25 SURGICAL SUPPLY — 50 items
ADH SKN CLS APL DERMABOND .7 (GAUZE/BANDAGES/DRESSINGS) ×1
APPLIER CLIP 9.375 MED OPEN (MISCELLANEOUS)
APR CLP MED 9.3 20 MLT OPN (MISCELLANEOUS)
BINDER BREAST LRG (GAUZE/BANDAGES/DRESSINGS) IMPLANT
BINDER BREAST MEDIUM (GAUZE/BANDAGES/DRESSINGS) IMPLANT
BINDER BREAST XLRG (GAUZE/BANDAGES/DRESSINGS) IMPLANT
BINDER BREAST XXLRG (GAUZE/BANDAGES/DRESSINGS) IMPLANT
BLADE SURG 15 STRL LF DISP TIS (BLADE) ×1 IMPLANT
BLADE SURG 15 STRL SS (BLADE) ×2
CANISTER SUC SOCK COL 7IN (MISCELLANEOUS) IMPLANT
CANISTER SUCT 1200ML W/VALVE (MISCELLANEOUS) ×1 IMPLANT
CHLORAPREP W/TINT 26ML (MISCELLANEOUS) ×2 IMPLANT
CLIP APPLIE 9.375 MED OPEN (MISCELLANEOUS) IMPLANT
COVER BACK TABLE 60X90IN (DRAPES) ×2 IMPLANT
COVER MAYO STAND STRL (DRAPES) ×2 IMPLANT
COVER PROBE W GEL 5X96 (DRAPES) ×2 IMPLANT
DECANTER SPIKE VIAL GLASS SM (MISCELLANEOUS) IMPLANT
DERMABOND ADVANCED (GAUZE/BANDAGES/DRESSINGS) ×1
DERMABOND ADVANCED .7 DNX12 (GAUZE/BANDAGES/DRESSINGS) ×1 IMPLANT
DEVICE DUBIN W/COMP PLATE 8390 (MISCELLANEOUS) ×2 IMPLANT
DRAPE LAPAROSCOPIC ABDOMINAL (DRAPES) ×2 IMPLANT
DRAPE LAPAROTOMY 100X72 PEDS (DRAPES) ×2 IMPLANT
DRAPE UTILITY XL STRL (DRAPES) ×2 IMPLANT
ELECT COATED BLADE 2.86 ST (ELECTRODE) ×2 IMPLANT
ELECT REM PT RETURN 9FT ADLT (ELECTROSURGICAL) ×2
ELECTRODE REM PT RTRN 9FT ADLT (ELECTROSURGICAL) ×1 IMPLANT
GLOVE BIO SURGEON STRL SZ7 (GLOVE) ×2 IMPLANT
GLOVE BIOGEL PI IND STRL 8 (GLOVE) ×1 IMPLANT
GLOVE BIOGEL PI INDICATOR 8 (GLOVE) ×1
GLOVE ECLIPSE 8.0 STRL XLNG CF (GLOVE) ×2 IMPLANT
GOWN STRL REUS W/ TWL LRG LVL3 (GOWN DISPOSABLE) ×2 IMPLANT
GOWN STRL REUS W/TWL LRG LVL3 (GOWN DISPOSABLE) ×4
HEMOSTAT ARISTA ABSORB 3G PWDR (MISCELLANEOUS) IMPLANT
HEMOSTAT SNOW SURGICEL 2X4 (HEMOSTASIS) IMPLANT
KIT MARKER MARGIN INK (KITS) ×2 IMPLANT
NDL HYPO 25X1 1.5 SAFETY (NEEDLE) ×1 IMPLANT
NEEDLE HYPO 25X1 1.5 SAFETY (NEEDLE) ×2 IMPLANT
NS IRRIG 1000ML POUR BTL (IV SOLUTION) ×2 IMPLANT
PACK BASIN DAY SURGERY FS (CUSTOM PROCEDURE TRAY) ×2 IMPLANT
PENCIL BUTTON HOLSTER BLD 10FT (ELECTRODE) ×2 IMPLANT
SLEEVE SCD COMPRESS KNEE MED (MISCELLANEOUS) ×2 IMPLANT
SPONGE LAP 4X18 RFD (DISPOSABLE) ×2 IMPLANT
SUT MNCRL AB 4-0 PS2 18 (SUTURE) ×2 IMPLANT
SUT SILK 2 0 SH (SUTURE) IMPLANT
SUT VICRYL 3-0 CR8 SH (SUTURE) ×2 IMPLANT
SYR CONTROL 10ML LL (SYRINGE) ×2 IMPLANT
TOWEL OR 17X24 6PK STRL BLUE (TOWEL DISPOSABLE) ×2 IMPLANT
TOWEL OR NON WOVEN STRL DISP B (DISPOSABLE) ×2 IMPLANT
TUBE CONNECTING 20X1/4 (TUBING) ×1 IMPLANT
YANKAUER SUCT BULB TIP NO VENT (SUCTIONS) ×1 IMPLANT

## 2018-04-25 NOTE — Discharge Instructions (Signed)
[facility] °Office Phone Number [phone] ° °BREAST BIOPSY/ LUMPECTOMY: POST OP INSTRUCTIONS ° °Always review your discharge instruction sheet given to you by the facility where your surgery was performed. ° °IF YOU HAVE DISABILITY OR FAMILY LEAVE FORMS, YOU MUST BRING THEM TO THE OFFICE FOR PROCESSING.  DO NOT GIVE THEM TO YOUR DOCTOR. ° °1. A prescription for pain medication may be given to you upon discharge.  Take your pain medication as prescribed, if needed.  If narcotic pain medicine is not needed, then you may take acetaminophen (Tylenol) or ibuprofen (Advil) as needed. °2. Take your usually prescribed medications unless otherwise directed °3. If you need a refill on your pain medication, please contact your pharmacy.  They will contact our office to request authorization.  Prescriptions will not be filled after 5pm or on week-ends. °4. You should eat very light the first 24 hours after surgery, such as soup, crackers, pudding, etc.  Resume your normal diet the day after surgery. °5. Most patients will experience some swelling and bruising in the breast.  Ice packs and a good support bra will help.  Swelling and bruising can take several days to resolve.  °6. It is common to experience some constipation if taking pain medication after surgery.  Increasing fluid intake and taking a stool softener will usually help or prevent this problem from occurring.  A mild laxative (Milk of Magnesia or Miralax) should be taken according to package directions if there are no bowel movements after 48 hours. °7. Unless discharge instructions indicate otherwise, you may remove your bandages 24-48 hours after surgery, and you may shower at that time.  You may have steri-strips (small skin tapes) in place directly over the incision.  These strips should be left on the skin for 7-10 days.  If your surgeon used skin glue on the incision, you may shower in 24 hours.  The glue will flake off over the next 2-3  weeks.  Any sutures or staples will be removed at the office during your follow-up visit. °8. ACTIVITIES:  You may resume regular daily activities (gradually increasing) beginning the next day.  Wearing a good support bra or sports bra minimizes pain and swelling.  You may have sexual intercourse when it is comfortable. °a. You may drive when you no longer are taking prescription pain medication, you can comfortably wear a seatbelt, and you can safely maneuver your car and apply brakes. °b. RETURN TO WORK:  ______________________________________________________________________________________ °9. You should see your doctor in the office for a follow-up appointment approximately two weeks after your surgery.  Your doctor’s nurse will typically make your follow-up appointment when she calls you with your pathology report.  Expect your pathology report 2-3 business days after your surgery.  You may call to check if you do not hear from us after three days. °10. OTHER INSTRUCTIONS: _______________________________________________________________________________________________ _____________________________________________________________________________________________________________________________________ °_____________________________________________________________________________________________________________________________________ °_____________________________________________________________________________________________________________________________________ ° °WHEN TO CALL YOUR DOCTOR: °1. Fever over 101.0 °2. Nausea and/or vomiting. °3. Extreme swelling or bruising. °4. Continued bleeding from incision. °5. Increased pain, redness, or drainage from the incision. ° °The clinic staff is available to answer your questions during regular business hours.  Please don’t hesitate to call and ask to speak to one of the nurses for clinical concerns.  If you have a medical emergency, go to the nearest emergency  room or call 911.  A surgeon from [facility] is always on call at the hospital. ° °For further questions, please visit [email or web address]  ° °  Post Anesthesia Home Care Instructions  NO TYLENOL BEFORE 1:30PM TODAY  Activity: Get plenty of rest for the remainder of the day. A responsible individual must stay with you for 24 hours following the procedure.  For the next 24 hours, DO NOT: -Drive a car -Paediatric nurse -Drink alcoholic beverages -Take any medication unless instructed by your physician -Make any legal decisions or sign important papers.  Meals: Start with liquid foods such as gelatin or soup. Progress to regular foods as tolerated. Avoid greasy, spicy, heavy foods. If nausea and/or vomiting occur, drink only clear liquids until the nausea and/or vomiting subsides. Call your physician if vomiting continues.  Special Instructions/Symptoms: Your throat may feel dry or sore from the anesthesia or the breathing tube placed in your throat during surgery. If this causes discomfort, gargle with warm salt water. The discomfort should disappear within 24 hours.  If you had a scopolamine patch placed behind your ear for the management of post- operative nausea and/or vomiting:  1. The medication in the patch is effective for 72 hours, after which it should be removed.  Wrap patch in a tissue and discard in the trash. Wash hands thoroughly with soap and water. 2. You may remove the patch earlier than 72 hours if you experience unpleasant side effects which may include dry mouth, dizziness or visual disturbances. 3. Avoid touching the patch. Wash your hands with soap and water after contact with the patch.

## 2018-04-25 NOTE — Transfer of Care (Signed)
Immediate Anesthesia Transfer of Care Note  Patient: [name]  Procedure(s) Performed: RIGHT BREAST LUMPECTOMY WITH RADIOACTIVE SEED LOCALIZATION ERAS PATHWAY (Right Breast)  Patient Location: PACU  Anesthesia Type:General  Level of Consciousness: sedated  Airway & Oxygen Therapy: Patient Spontanous Breathing and Patient connected to face mask oxygen  Post-op Assessment: Report given to RN and Post -op Vital signs reviewed and stable  Post vital signs: Reviewed and stable  Last Vitals:  Vitals Value Taken Time  BP    Temp    Pulse    Resp    SpO2      Last Pain:  Vitals:   04/25/18 0718  TempSrc: Oral  PainSc: 0-No pain      Patients Stated Pain Goal: 0 (18/29/93 7169)  Complications: No apparent anesthesia complications

## 2018-04-25 NOTE — Op Note (Signed)
Preoperative diagnosis: Right breast mass  Postoperative diagnosis: Same  Procedure: Right breast seed localized lumpectomy  Surgeon: Erroll Luna, MD  Anesthesia: LMA with local  EBL: 10 cc  Drains: None  IV fluids: Per anesthesia record  Specimen: Right breast tissue with seed and clip verified by Faxitron  Indications for procedure: The patient presents for right breast lumpectomy secondary to an abnormal mammogram.  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  Risk, benefits and other options discussed with the patient.The procedure has been discussed with the patient. Alternatives to surgery have been discussed with the patient.  Risks of surgery include bleeding,  Infection,  Seroma formation, death,  and the need for further surgery.   The patient understands and wishes to proceed.  Description of procedure: The patient presents to the operating room for right breast lumpectomy.  She underwent seed placement in radiology.  Questions were answered and the neoprobe was used to verify seed activity and location in the right breast and this was marked as the correct side.  She was taken back to the operating room.  She was placed supine on the operating room table.  After induction of general anesthesia, right breast was prepped and draped in sterile fashion and she received preoperative antibiotics and a timeout was done.  Neoprobe was used to verify the seed location right breast lower outer quadrant.  Curvilinear incision was made over the seed location after infiltration of the skin with local anesthetic.  All tissue around the seed and clip were excised with a grossly negative margin.  Hemostasis was achieved.  Faxitron image revealed both the seating clip to be in the specimen and this was sent on to pathology.  The incision was closed  with a combination of 3-0 Vicryl and 4-0 Monocryl.  Dermabond applied.  All final counts found to be correct.  The patient was awoke extubated taken to recovery in satisfactory condition.

## 2018-04-25 NOTE — Anesthesia Procedure Notes (Signed)
Procedure Name: LMA Insertion Date/Time: 04/25/2018 8:31 AM Performed by: Willa Frater, CRNA Pre-anesthesia Checklist: Patient identified, Emergency Drugs available, Suction available and Patient being monitored Patient Re-evaluated:Patient Re-evaluated prior to induction Oxygen Delivery Method: Circle system utilized Preoxygenation: Pre-oxygenation with 100% oxygen Induction Type: IV induction Ventilation: Mask ventilation without difficulty LMA: LMA inserted LMA Size: 4.0 Number of attempts: 1 Airway Equipment and Method: Bite block Placement Confirmation: positive ETCO2 Tube secured with: Tape Dental Injury: Teeth and Oropharynx as per pre-operative assessment

## 2018-04-25 NOTE — Anesthesia Preprocedure Evaluation (Signed)
Anesthesia Evaluation  Patient identified by MRN, date of birth, ID band Patient awake    Reviewed: Allergy & Precautions, NPO status , Patient's Chart, lab work & pertinent test results  Airway Mallampati: II       Dental no notable dental hx. (+) Teeth Intact   Pulmonary neg pulmonary ROS,    Pulmonary exam normal breath sounds clear to auscultation       Cardiovascular negative cardio ROS Normal cardiovascular exam Rhythm:Regular Rate:Normal     Neuro/Psych negative neurological ROS  negative psych ROS   GI/Hepatic negative GI ROS, Neg liver ROS,   Endo/Other  Right Breast Mass Obesity  Renal/GU negative Renal ROS  negative genitourinary   Musculoskeletal negative musculoskeletal ROS (+)   Abdominal (+) + obese,   Peds  Hematology   Anesthesia Other Findings   Reproductive/Obstetrics negative OB ROS                             Anesthesia Physical Anesthesia Plan  ASA: II  Anesthesia Plan: General   Post-op Pain Management:    Induction: Intravenous  PONV Risk Score and Plan: 4 or greater and Midazolam, Dexamethasone, Ondansetron and Treatment may vary due to age or medical condition  Airway Management Planned: LMA  Additional Equipment:   Intra-op Plan:   Post-operative Plan: Extubation in OR  Informed Consent: I have reviewed the patients History and Physical, chart, labs and discussed the procedure including the risks, benefits and alternatives for the proposed anesthesia with the patient or authorized representative who has indicated his/her understanding and acceptance.   Dental advisory given  Plan Discussed with: CRNA, Anesthesiologist and Surgeon  Anesthesia Plan Comments:         Anesthesia Quick Evaluation

## 2018-04-25 NOTE — H&P (Signed)
[name] Location: [facility] Patient #: [id number] DOB: May 30, 1976 Married / Language: English / Race: Black or African American Female  History of Present Illness Patient words: breast mass   Patient sent at the request of Dr. Luan Pulling from [facility] for mammographic abnormality of right breast. She went for screening mammogram and subsequent diagnostic mammogram with ultrasound and core biopsy of an area in her right breast upper outer quadrant. The pathology has not been finalized and was sent out for a third opinion. 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 I do not have the report back and have requested this from pathology. Patient has a family history of breast cancer both on the maternal and paternal side. Patient noticed some fullness in her right breast in the upper outer quadrant which prompted the mammogram this time which was about a month and a half ago. The area is now gone. She denies any nipple discharge or problem with either breast.  The patient is a 42 year old female.   Past Surgical History  Breast Biopsy Right. Cesarean Section - Multiple Tonsillectomy  Diagnostic Studies History  Colonoscopy never Mammogram within last year Pap Smear 1-5 years ago  Allergies  No Known Drug Allergies [01/22/2018]:  Medication History  No Current Medications Medications Reconciled  Social History  Alcohol use Occasional alcohol use. Caffeine use Carbonated beverages, Coffee. No drug use Tobacco use Never smoker.  Family History  Alcohol Abuse Father. Breast Cancer Mother. Diabetes Mellitus Mother. Heart disease in female family member before age 37 Hypertension Mother.  Pregnancy / Birth History  Age at menarche 31 years. Contraceptive History Intrauterine device. Gravida 4 Length (months) of breastfeeding 12-24 Maternal age 46-20 Para 3 Regular periods  Other Problems  Back  Pain     Review of Systems  General Not Present- Appetite Loss, Chills, Fatigue, Fever, Night Sweats, Weight Gain and Weight Loss. Skin Not Present- Change in Wart/Mole, Dryness, Hives, Jaundice, New Lesions, Non-Healing Wounds, Rash and Ulcer. HEENT Not Present- Earache, Hearing Loss, Hoarseness, Nose Bleed, Oral Ulcers, Ringing in the Ears, Seasonal Allergies, Sinus Pain, Sore Throat, Visual Disturbances, Wears glasses/contact lenses and Yellow Eyes. Respiratory Not Present- Bloody sputum, Chronic Cough, Difficulty Breathing, Snoring and Wheezing. Breast Not Present- Breast Mass, Breast Pain, Nipple Discharge and Skin Changes. Cardiovascular Not Present- Chest Pain, Difficulty Breathing Lying Down, Leg Cramps, Palpitations, Rapid Heart Rate, Shortness of Breath and Swelling of Extremities. Gastrointestinal Not Present- Abdominal Pain, Bloating, Bloody Stool, Change in Bowel Habits, Chronic diarrhea, Constipation, Difficulty Swallowing, Excessive gas, Gets full quickly at meals, Hemorrhoids, Indigestion, Nausea, Rectal Pain and Vomiting. Female Genitourinary Not Present- Frequency, Nocturia, Painful Urination, Pelvic Pain and Urgency. Musculoskeletal Not Present- Back Pain, Joint Pain, Joint Stiffness, Muscle Pain, Muscle Weakness and Swelling of Extremities. Neurological Not Present- Decreased Memory, Fainting, Headaches, Numbness, Seizures, Tingling, Tremor, Trouble walking and Weakness. Psychiatric Not Present- Anxiety, Bipolar, Change in Sleep Pattern, Depression, Fearful and Frequent crying. Endocrine Not Present- Cold Intolerance, Excessive Hunger, Hair Changes, Heat Intolerance, Hot flashes and New Diabetes. Hematology Not Present- Blood Thinners, Easy Bruising, Excessive bleeding, Gland problems, HIV and Persistent Infections.  Vitals  01/22/2018 11:31 AM Weight: 183.38 lb Height: 61in Body Surface Area: 1.82 m Body Mass Index: 34.65 kg/m  BP: 150/86 (Sitting, Left Arm,  Standard)      Physical Exam   General Mental Status-Alert. General Appearance-Consistent with stated age. Hydration-Well hydrated. Voice-Normal.  Head and Neck Head-normocephalic, atraumatic with no lesions or palpable masses. Trachea-midline.  Thyroid Gland Characteristics - normal size and consistency.  Eye Eyeball - Bilateral-Extraocular movements intact. Sclera/Conjunctiva - Bilateral-No scleral icterus.  Chest and Lung Exam Note: NO WHEEZING wob NORMAL  Breast Breast - Left-Symmetric, Non Tender, No Biopsy scars, no Dimpling, No Inflammation, No Lumpectomy scars, No Mastectomy scars, No Peau d' Orange. Breast - Right-Symmetric, Non Tender, No Biopsy scars, no Dimpling, No Inflammation, No Lumpectomy scars, No Mastectomy scars, No Peau d' Orange. Breast Lump-No Palpable Breast Mass.  Cardiovascular Note: SR  Neurologic Neurologic evaluation reveals -alert and oriented x 3 with no impairment of recent or remote memory. Mental Status-Normal.  Lymphatic Head & Neck  General Head & Neck Lymphatics: Bilateral - Description - Normal. Axillary  General Axillary Region: Bilateral - Description - Normal. Tenderness - Non Tender.    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 Pt agrees to proceed.  Current Plans Pt Education - [facility] Breast Biopsy HCI: discussed with patient and provided information. The anatomy and the physiology was discussed. The pathophysiology and natural history of the disease was discussed. Options were discussed and recommendations were made. Technique, risks, benefits, & alternatives were discussed. Risks such as stroke, heart attack, bleeding, indection, death, and other risks discussed. Questions  answered. The patient agrees to proceed

## 2018-04-25 NOTE — Interval H&P Note (Signed)
History and Physical Interval Note:  04/25/2018 8:20 AM  [name]  has presented today for surgery, with the diagnosis of right breast mass  The various methods of treatment have been discussed with the patient and family. After consideration of risks, benefits and other options for treatment, the patient has consented to  Procedure(s): RIGHT BREAST LUMPECTOMY WITH RADIOACTIVE SEED LOCALIZATION ERAS PATHWAY (Right) as a surgical intervention .  The patient's history has been reviewed, patient examined, no change in status, stable for surgery.  I have reviewed the patient's chart and labs.  Questions were answered to the patient's satisfaction.     Remer

## 2018-04-25 NOTE — Anesthesia Postprocedure Evaluation (Signed)
Anesthesia Post Note  Patient: Sport and exercise psychologist  Procedure(s) Performed: RIGHT BREAST LUMPECTOMY WITH RADIOACTIVE SEED LOCALIZATION ERAS PATHWAY (Right Breast)     Patient location during evaluation: PACU Anesthesia Type: General Level of consciousness: awake and alert and oriented Pain management: pain level controlled Vital Signs Assessment: post-procedure vital signs reviewed and stable Respiratory status: spontaneous breathing, nonlabored ventilation and respiratory function stable Cardiovascular status: blood pressure returned to baseline and stable Postop Assessment: no apparent nausea or vomiting Anesthetic complications: no    Last Vitals:  Vitals:   04/25/18 0915 04/25/18 0930  BP: (!) 93/58 114/80  Pulse: 73 85  Resp: 10 15  Temp:    SpO2: 100% 100%    Last Pain:  Vitals:   04/25/18 0945  TempSrc:   PainSc: 0-No pain                 Brainard Highfill A.

## 2018-04-26 ENCOUNTER — Encounter (HOSPITAL_BASED_OUTPATIENT_CLINIC_OR_DEPARTMENT_OTHER): Payer: Self-pay | Admitting: Surgery

## 2018-05-18 MED FILL — PANTOPRAZOLE SOD DR 40 MG T: 40 | 30 days supply | Qty: 30 | Fill #0

## 2018-07-06 MED FILL — SCOPOLAMINE 1 MG/3DAYS PT72: 1 | 12 days supply | Qty: 4 | Fill #0

## 2018-07-20 MED FILL — FERROUS GLUCONATE 324 MG TA: 324 (38 FE) | 34 days supply | Qty: 100 | Fill #0

## 2018-08-03 ENCOUNTER — Other Ambulatory Visit: Payer: Self-pay | Admitting: Obstetrics & Gynecology

## 2018-08-03 ENCOUNTER — Ambulatory Visit (HOSPITAL_COMMUNITY)
Admission: RE | Admit: 2018-08-03 | Discharge: 2018-08-03 | Disposition: A | Discharge status: Home / Self Care | Payer: No Typology Code available for payment source | Source: Ambulatory Visit | Attending: Obstetrics and Gynecology | Admitting: Obstetrics and Gynecology

## 2018-08-03 DIAGNOSIS — D509 Iron deficiency anemia, unspecified: Secondary | ICD-10-CM | POA: Diagnosis not present

## 2018-08-03 MED ORDER — SODIUM CHLORIDE 0.9 % IV SOLN
INTRAVENOUS | Status: DC | PRN
  Administered 2018-08-03: 250 mL via INTRAVENOUS

## 2018-08-03 MED ORDER — SODIUM CHLORIDE 0.9 % IV SOLN
510.0000 mg | Freq: Once | INTRAVENOUS | Status: AC
  Administered 2018-08-03: 510 mg via INTRAVENOUS
  Filled 2018-08-03: qty 17

## 2018-08-03 NOTE — Progress Notes (Signed)
PATIENT CARE CENTER NOTE  Diagnosis: Iron Deficiency Anemia    Provider: Dr. Servando Salina    Procedure: IV Feraheme    Note: Patient received Feraheme infusion. Tolerated infusion well. Monitored patient 30 minutes post-infusion. Vitals remained stable and no adverse reaction noted. Discharge instructions given. Patient alert, oriented and ambulatory at discharge.

## 2018-08-03 NOTE — Discharge Instructions (Signed)

## 2018-08-14 ENCOUNTER — Ambulatory Visit (HOSPITAL_COMMUNITY)
Admission: RE | Admit: 2018-08-14 | Discharge: 2018-08-14 | Disposition: A | Discharge status: Home / Self Care | Payer: No Typology Code available for payment source | Source: Ambulatory Visit | Attending: Obstetrics and Gynecology | Admitting: Obstetrics and Gynecology

## 2018-08-14 ENCOUNTER — Encounter (HOSPITAL_COMMUNITY): Payer: No Typology Code available for payment source

## 2018-08-14 DIAGNOSIS — D509 Iron deficiency anemia, unspecified: Secondary | ICD-10-CM | POA: Diagnosis not present

## 2018-08-14 MED ORDER — SODIUM CHLORIDE 0.9 % IV SOLN
510.0000 mg | Freq: Once | INTRAVENOUS | Status: AC
  Administered 2018-08-14: 510 mg via INTRAVENOUS
  Filled 2018-08-14: qty 17

## 2018-08-14 MED ORDER — SODIUM CHLORIDE 0.9 % IV SOLN
INTRAVENOUS | Status: DC | PRN
  Administered 2018-08-14: 250 mL via INTRAVENOUS

## 2018-08-14 NOTE — Discharge Instructions (Signed)

## 2018-08-14 NOTE — Progress Notes (Signed)
PATIENT CARE CENTER NOTE  Diagnosis: Iron Deficiency Anemia    Provider: Dr. Servando Salina    Procedure: IV Feraheme    Note: Patient received Feraheme infusion. Tolerated infusion well. Monitored patient 30 minutes post-infusion. Vitals remained stable and no adverse reaction noted. Discharge instructions given. Patient alert, oriented and ambulatory at discharge.

## 2018-08-17 MED FILL — CHLORHEXIDINE 0.12% RINSE: 0.12 | 16 days supply | Qty: 473 | Fill #0

## 2018-12-17 MED FILL — KETOCONAZOLE 2% SHAMPOO: 2 | 30 days supply | Qty: 120 | Fill #0

## 2019-01-01 MED FILL — PIMECROLIMUS 1 % CREA: 1 | 15 days supply | Qty: 30 | Fill #0

## 2019-02-21 MED FILL — VYVANSE 10 MG CAPSULE: 10 | 30 days supply | Qty: 30 | Fill #0

## 2019-03-26 MED FILL — VYVANSE 20 MG CAPSULE: 20 | 30 days supply | Qty: 30 | Fill #0

## 2019-05-07 MED FILL — DOXYCYCLINE HYC 100 MG CAPS: 100 | 30 days supply | Qty: 30 | Fill #0

## 2019-05-07 MED FILL — KETOCONAZOLE 2% CREAM: 2 | 15 days supply | Qty: 30 | Fill #0

## 2019-05-07 MED FILL — HYDROCORTISONE 2.5% CREAM: 2.5 | 30 days supply | Qty: 30 | Fill #0

## 2019-07-02 MED FILL — METHYLPHENIDATE ER 20 MG CA: 20 | 30 days supply | Qty: 30 | Fill #0

## 2019-10-10 ENCOUNTER — Encounter (INDEPENDENT_AMBULATORY_CARE_PROVIDER_SITE_OTHER): Payer: Self-pay | Admitting: Bariatrics

## 2019-10-10 ENCOUNTER — Ambulatory Visit (INDEPENDENT_AMBULATORY_CARE_PROVIDER_SITE_OTHER): Payer: No Typology Code available for payment source | Admitting: Bariatrics

## 2019-10-10 ENCOUNTER — Other Ambulatory Visit: Payer: Self-pay

## 2019-10-10 ENCOUNTER — Encounter: Payer: Self-pay | Admitting: Bariatrics

## 2019-10-10 VITALS — BP 124/84 | HR 78 | Temp 98.5°F | Ht 61.0 in | Wt 176.0 lb

## 2019-10-10 DIAGNOSIS — Z0289 Encounter for other administrative examinations: Secondary | ICD-10-CM

## 2019-10-10 DIAGNOSIS — R5383 Other fatigue: Secondary | ICD-10-CM | POA: Diagnosis not present

## 2019-10-10 DIAGNOSIS — Z1331 Encounter for screening for depression: Secondary | ICD-10-CM

## 2019-10-10 DIAGNOSIS — E669 Obesity, unspecified: Secondary | ICD-10-CM

## 2019-10-10 DIAGNOSIS — Z9189 Other specified personal risk factors, not elsewhere classified: Secondary | ICD-10-CM

## 2019-10-10 DIAGNOSIS — E559 Vitamin D deficiency, unspecified: Secondary | ICD-10-CM | POA: Diagnosis not present

## 2019-10-10 DIAGNOSIS — R0602 Shortness of breath: Secondary | ICD-10-CM | POA: Diagnosis not present

## 2019-10-10 DIAGNOSIS — E6609 Other obesity due to excess calories: Secondary | ICD-10-CM

## 2019-10-10 DIAGNOSIS — Z6833 Body mass index (BMI) 33.0-33.9, adult: Secondary | ICD-10-CM

## 2019-10-10 NOTE — Progress Notes (Signed)
.  Office: [phone]  /  Fax: [phone]   HPI:   Chief Complaint: OBESITY  [name] (MR# [id number]) is a 43 y.o. female who presents on 10/10/2019 for obesity evaluation and treatment. Current BMI is Body mass index is 33.25 kg/m.Marland Kitchen Chenee has struggled with obesity for years and has been unsuccessful in either losing weight or maintaining long term weight loss. Ahna attended our information session and states she is currently in the action stage of change and ready to dedicate time achieving and maintaining a healthier weight.   Kindall does like to [employer], but states time is a restriction. She craves fruit and sweets. She had labs on 09/06/2019. (Her last A1c was reported to be 5.2).  Nadalee states her family eats meals together she thinks her family will eat healthier with her her desired weight loss is 46 lbs she started gaining weight in 2010 her heaviest weight ever was 180 lbs. she has significant food cravings issues  she skips breakfast when working she frequently makes poor food choices she has problems with excessive hunger  she frequently eats larger portions than normal  she struggles with emotional eating    Fatigue Monaye feels her energy is lower than it should be. This has worsened with weight gain and has worsened recently. Ludmilla denies daytime somnolence and  admits to waking up still tired. Patient generally gets 5-7 hours of sleep per night, and states they generally have restful sleep. Snoring is present. Apneic episodes are not present. Epworth Sleepiness Score is 11. Lynsee reports fatigue and shortness of breath with certain activities.  Dyspnea on exertion Jacinta notes increasing shortness of breath with exercising and seems to be worsening over time with weight gain. She notes getting out of breath sooner with activity than she used to. This has gotten worse recently. Shyan denies orthopnea.   Vitamin D deficiency Kirsti has a diagnosis of Vitamin D  deficiency. She is currently taking Vit D and denies nausea, vomiting or muscle weakness.  At risk for osteopenia and osteoporosis Kaylin is at higher risk of osteopenia and osteoporosis due to Vitamin D deficiency.   Depression Screen Sharmaine's Food and Mood (modified PHQ-9) score was 8. Depression screen PHQ 2/9 10/10/2019  Decreased Interest 1  Down, Depressed, Hopeless 0  PHQ - 2 Score 1  Altered sleeping 1  Tired, decreased energy 3  Change in appetite 1  Feeling bad or failure about yourself  1  Trouble concentrating 1  Moving slowly or fidgety/restless 0  Suicidal thoughts 0  PHQ-9 Score 8  Difficult doing work/chores Not difficult at all    ASSESSMENT AND PLAN:  Other fatigue - Plan: EKG 12-Lead, Comprehensive metabolic panel, T3, T4, free, TSH  SOB (shortness of breath) on exertion  Vitamin D deficiency - Plan: Vitamin D (25 hydroxy)  Depression screening  At risk for osteoporosis  Class 1 obesity with serious comorbidity and body mass index (BMI) of 33.0 to 33.9 in adult, unspecified obesity type  PLAN:  Fatigue Laquana was informed that her fatigue may be related to obesity, depression or many other causes. Labs will be ordered, and in the meanwhile Castella has agreed to work on diet, exercise and weight loss to help with fatigue. Proper sleep hygiene was discussed including the need for 7-8 hours of quality sleep each night. A sleep study was not ordered based on symptoms and Epworth score. Antwan will increase her activities.  Dyspnea on exertion Cynithia's shortness of breath appears to  be obesity related and exercise induced. She has agreed to work on weight loss and gradually increase exercise to treat her exercise induced shortness of breath. If Kailana follows our instructions and loses weight without improvement of her shortness of breath, we will plan to refer to pulmonology. We will monitor this condition regularly. Niyonna agrees to this plan.  Vitamin D Deficiency  Carey was informed that low Vitamin D levels contributes to fatigue and are associated with obesity, breast, and colon cancer. She will have routine testing of Vitamin D and follow-up with our clinic in 2-3 weeks.  At risk for osteopenia and osteoporosis Stephanine was given extended  (15 minutes) osteoporosis prevention counseling today. Alyxis is at risk for osteopenia and osteoporosis due to her Vitamin D deficiency. She was encouraged to take her Vitamin D and follow her higher calcium diet and increase strengthening exercise to help strengthen her bones and decrease her risk of osteopenia and osteoporosis.  Depression Screen Maymunah had a mildly positive depression screening. Depression is commonly associated with obesity and often results in emotional eating behaviors. We will monitor this closely and work on CBT to help improve the non-hunger eating patterns. Referral to Psychology may be required if no improvement is seen as she continues in our clinic.  Obesity Teofila is currently in the action stage of change and her goal is to continue with weight loss efforts. She has agreed to follow the Category 3 plan with additional Category 3 options. Elanor will decrease eating out and will decrease skipping meals. Tamico has been instructed to work up to a goal of 150 minutes of combined cardio and strengthening exercise per week for weight loss and overall health benefits. We discussed the following Behavioral Modification Strategies today: increasing lean protein intake, decreasing simple carbohydrates, increasing vegetables, increase H20 intake, decrease eating out, no skipping meals, work on meal planning and easy cooking plans, and keeping healthy foods in the home.  Ellese has agreed to follow-up with our clinic in 2-3 weeks. She was informed of the importance of frequent follow up visits to maximize her success with intensive lifestyle modifications for her multiple health conditions. She was informed  we would discuss her lab results at her next visit unless there is a critical issue that needs to be addressed sooner. Caitlinn agreed to keep her next visit at the agreed upon time to discuss these results.  ALLERGIES: No Known Allergies  MEDICATIONS: Current Outpatient Medications on File Prior to Visit  Medication Sig Dispense Refill  . ibuprofen (ADVIL,MOTRIN) 800 MG tablet Take 1 tablet (800 mg total) by mouth every 8 (eight) hours as needed. 30 tablet 0   No current facility-administered medications on file prior to visit.     PAST MEDICAL HISTORY: Past Medical History:  Diagnosis Date  . ADD (attention deficit disorder)   . Breast mass, right 04/2018  . Seborrheic dermatitis   . Vitamin D deficiency     PAST SURGICAL HISTORY: Past Surgical History:  Procedure Laterality Date  . BREAST LUMPECTOMY WITH RADIOACTIVE SEED LOCALIZATION Right 04/25/2018   Procedure: RIGHT BREAST LUMPECTOMY WITH RADIOACTIVE SEED LOCALIZATION ERAS PATHWAY;  Surgeon: Erroll Luna, MD;  Location: [place];  Service: General;  Laterality: Right;  . CESAREAN SECTION  10/15/2006  . CESAREAN SECTION  09/17/2008  . CESAREAN SECTION     3 total  . DILATION AND EVACUATION  12/10/2005  . TONSILLECTOMY      SOCIAL HISTORY: Social History   Tobacco Use  .  Smoking status: Never Smoker  . Smokeless tobacco: Never Used  Substance Use Topics  . Alcohol use: Yes    Comment: occasionally  . Drug use: Never    FAMILY HISTORY: Family History  Problem Relation Age of Onset  . Diabetes Mother   . High blood pressure Mother   . Cancer Mother   . Depression Mother   . Anxiety disorder Mother   . Bipolar disorder Mother   . Drug abuse Mother   . Obesity Mother   . Alcoholism Father   . Drug abuse Father   . Obesity Father    ROS: Review of Systems  Eyes:       Positive for vision changes. Positive for wearing glasses or contacts.  Cardiovascular: Negative for orthopnea.   Gastrointestinal: Negative for nausea and vomiting.  Musculoskeletal:       Negative for muscle weakness.  Skin:       Positive for skin dryness. Positive for hair or nail changes.   PHYSICAL EXAM: Blood pressure 124/84, pulse 78, temperature 98.5 F (36.9 C), height 5\' 1"  (1.549 m), weight 176 lb (79.8 kg), last menstrual period 09/05/2011, SpO2 98 %. Body mass index is 33.25 kg/m. Physical Exam Vitals signs reviewed.  Constitutional:      Appearance: Normal appearance. She is well-developed. She is obese.  HENT:     Head: Normocephalic and atraumatic.     Nose: Nose normal.  Eyes:     General: No scleral icterus. Neck:     Musculoskeletal: Normal range of motion.  Cardiovascular:     Rate and Rhythm: Normal rate and regular rhythm.  Pulmonary:     Effort: Pulmonary effort is normal. No respiratory distress.  Abdominal:     Palpations: Abdomen is soft.     Tenderness: There is no abdominal tenderness.  Musculoskeletal: Normal range of motion.     Comments: Range of motion normal in all four extremities.  Skin:    General: Skin is warm and dry.  Neurological:     Mental Status: She is alert and oriented to person, place, and time.     Coordination: Coordination normal.  Psychiatric:        Mood and Affect: Mood and affect normal.        Behavior: Behavior normal.   RECENT LABS AND TESTS: BMET No results found for: NA, K, CL, CO2, GLUCOSE, BUN, CREATININE, CALCIUM, GFRNONAA, GFRAA No results found for: HGBA1C No results found for: INSULIN CBC    Component Value Date/Time   WBC 12.3 (H) 09/18/2008 0525   RBC 3.33 (L) 09/18/2008 0525   HGB 9.2 DELTA CHECK NOTED (L) 09/18/2008 0525   HCT 28.4 (L) 09/18/2008 0525   PLT 198 09/18/2008 0525   MCV 85.2 09/18/2008 0525   MCHC 32.5 09/18/2008 0525   RDW 14.9 09/18/2008 0525   Iron/TIBC/Ferritin/ %Sat No results found for: IRON, TIBC, FERRITIN, IRONPCTSAT Lipid Panel  No results found for: CHOL, TRIG, HDL, CHOLHDL,  VLDL, LDLCALC, LDLDIRECT Hepatic Function Panel  No results found for: PROT, ALBUMIN, AST, ALT, ALKPHOS, BILITOT, BILIDIR, IBILI No results found for: TSH  No results found for: Vitamin D, 25-Hydroxy  ECG shows sinus rhythm with a rate of 73 BPM. Old anteroseptal infarct. Abnormal.  INDIRECT CALORIMETER done today shows a VO2 of 260 and a REE of 1811. Her calculated basal metabolic rate is [id number] thus her basal metabolic rate is better than expected.  OBESITY BEHAVIORAL INTERVENTION VISIT  Today's visit was #1  Starting weight: 176 lbs Starting date: 10/10/2019 Today's weight: 176 lbs  Today's date: 10/10/2019 Total lbs lost to date: 0    10/10/2019  Height 5\' 1"  (1.549 m)  Weight 176 lb (79.8 kg)  BMI (Calculated) 33.27  BLOOD PRESSURE - SYSTOLIC [id number]  BLOOD PRESSURE - DIASTOLIC 84  Waist Measurement  39 inches   Body Fat % 40.5 %  Total Body Water (lbs) 69.2 lbs   ASK: We discussed the diagnosis of obesity with Jamee Laveda Abbe today and Yuleni agreed to give [place] permission to discuss obesity behavioral modification therapy today.  ASSESS: Majesty has the diagnosis of obesity and her BMI today is 33.4. Emanuel is in the action stage of change.   ADVISE: Jamisha was educated on the multiple health risks of obesity as well as the benefit of weight loss to improve her health. She was advised of the need for long term treatment and the importance of lifestyle modifications to improve her current health and to decrease her risk of future health problems.  AGREE: Multiple dietary modification options and treatment options were discussed and  Sunni agreed to follow the recommendations documented in the above note.  ARRANGE: Antonina was educated on the importance of frequent visits to treat obesity as outlined per CMS and USPSTF guidelines and agreed to schedule her next follow up appointment today.  Migdalia Dk, am acting as Location manager for [employer], DO   I have reviewed the  above documentation for accuracy and completeness, and I agree with the above. -Jearld Lesch, DO

## 2019-10-11 LAB — COMPREHENSIVE METABOLIC PANEL
ALT: 19 IU/L (ref 0–32)
AST: 18 IU/L (ref 0–40)
Albumin/Globulin Ratio: 1.6 (ref 1.2–2.2)
Albumin: 4.4 g/dL (ref 3.8–4.8)
Alkaline Phosphatase: 62 IU/L (ref 39–117)
BUN/Creatinine Ratio: 16 (ref 9–23)
BUN: 13 mg/dL (ref 6–24)
Bilirubin Total: 0.5 mg/dL (ref 0.0–1.2)
CO2: 24 mmol/L (ref 20–29)
Calcium: 10.1 mg/dL (ref 8.7–10.2)
Chloride: 98 mmol/L (ref 96–106)
Creatinine, Ser: 0.82 mg/dL (ref 0.57–1.00)
GFR calc Af Amer: 103 mL/min/{1.73_m2} (ref >59)
GFR calc non Af Amer: 89 mL/min/{1.73_m2} (ref >59)
Globulin, Total: 2.8 g/dL (ref 1.5–4.5)
Glucose: 89 mg/dL (ref 65–99)
Potassium: 4.3 mmol/L (ref 3.5–5.2)
Sodium: 138 mmol/L (ref 134–144)
Total Protein: 7.2 g/dL (ref 6.0–8.5)

## 2019-10-11 LAB — T3: T3, Total: 117 ng/dL (ref 71–180)

## 2019-10-11 LAB — T4, FREE: Free T4: 1.39 ng/dL (ref 0.82–1.77)

## 2019-10-11 LAB — VITAMIN D 25 HYDROXY (VIT D DEFICIENCY, FRACTURES): Vit D, 25-Hydroxy: 23 ng/mL — ABNORMAL LOW (ref 30.0–100.0)

## 2019-10-11 LAB — TSH: TSH: 0.809 u[IU]/mL (ref 0.450–4.500)

## 2019-10-14 ENCOUNTER — Encounter (INDEPENDENT_AMBULATORY_CARE_PROVIDER_SITE_OTHER): Payer: Self-pay | Admitting: Bariatrics

## 2019-10-14 DIAGNOSIS — E559 Vitamin D deficiency, unspecified: Secondary | ICD-10-CM | POA: Insufficient documentation

## 2019-10-14 MED FILL — HYDROCORTISONE 2.5% CREAM: 2.5 | 14 days supply | Qty: 28 | Fill #0

## 2019-10-14 MED FILL — metroNIDAZOLE 0.75 % GEL: 0.75 | 21 days supply | Qty: 45 | Fill #0

## 2019-10-24 ENCOUNTER — Encounter (INDEPENDENT_AMBULATORY_CARE_PROVIDER_SITE_OTHER): Payer: Self-pay | Admitting: Bariatrics

## 2019-10-24 ENCOUNTER — Other Ambulatory Visit: Payer: Self-pay

## 2019-10-24 ENCOUNTER — Ambulatory Visit (INDEPENDENT_AMBULATORY_CARE_PROVIDER_SITE_OTHER): Payer: No Typology Code available for payment source | Admitting: Bariatrics

## 2019-10-24 VITALS — BP 120/80 | HR 84 | Temp 98.6°F | Ht 61.0 in | Wt 179.0 lb

## 2019-10-24 DIAGNOSIS — Z9189 Other specified personal risk factors, not elsewhere classified: Secondary | ICD-10-CM

## 2019-10-24 DIAGNOSIS — E669 Obesity, unspecified: Secondary | ICD-10-CM

## 2019-10-24 DIAGNOSIS — E559 Vitamin D deficiency, unspecified: Secondary | ICD-10-CM

## 2019-10-24 DIAGNOSIS — Z6834 Body mass index (BMI) 34.0-34.9, adult: Secondary | ICD-10-CM

## 2019-10-24 DIAGNOSIS — R5383 Other fatigue: Secondary | ICD-10-CM | POA: Diagnosis not present

## 2019-10-24 MED ORDER — VITAMIN D (ERGOCALCIFEROL) 1.25 MG (50000 UNIT) PO CAPS: 50000.0000 [IU] | ORAL_CAPSULE | ORAL | 0 refills | Status: DC

## 2019-10-24 MED FILL — VIT D2 1.25 MG (50,000 UNIT: 1.25 MG | 28 days supply | Qty: 4 | Fill #0

## 2019-10-28 ENCOUNTER — Encounter (INDEPENDENT_AMBULATORY_CARE_PROVIDER_SITE_OTHER): Payer: Self-pay | Admitting: Bariatrics

## 2019-10-28 NOTE — Progress Notes (Signed)
Office: [phone]  /  Fax: [phone]   HPI:   Chief Complaint: OBESITY [name] is here to discuss her progress with her obesity treatment plan. She is on the Category 3 plan and is following her eating plan approximately 50% of the time. She states she is exercising 0 minutes 0 times per week. [name] is up 3 lbs due to her daughter's birthday and the anniversary of her mother's passing and so she ate more. She is not getting enough water. Her weight is 179 lb (81.2 kg) today and has had a weight gain of 3 lbs since her last visit. She has lost 0 lbs since starting treatment with [place].  Vitamin D deficiency [name] has a diagnosis of Vitamin D deficiency. Last Vitamin D 23.0 on 10/10/2019. She is not currently taking Vit D and denies nausea, vomiting or muscle weakness.  At risk for osteopenia and osteoporosis [name] is at higher risk of osteopenia and osteoporosis due to Vitamin D deficiency.   Fatigue [name] reports having fatigue, which is slightly improved. She had a CBC at [facility] on 10/07 which showed a hemoglobin of 14.6. She states she has a Mirena IUD and has a history of heavy periods. She reports having had iron infusions in the past.  ASSESSMENT AND PLAN:  Vitamin D deficiency - Plan: Vitamin D, Ergocalciferol, (DRISDOL) 1.25 MG (50000 UT) CAPS capsule  Other fatigue  At risk for osteoporosis  Class 1 obesity with serious comorbidity and body mass index (BMI) of 34.0 to 34.9 in adult, unspecified obesity type  PLAN:  Vitamin D Deficiency [name] was informed that low Vitamin D levels contributes to fatigue and are associated with obesity, breast, and colon cancer. She agrees to continue to take prescription Vit D @ 50,000 IU every week #4 with 0 refills and will follow-up for routine testing of Vitamin D, at least 2-3 times per year. She was informed of the risk of over-replacement of Vitamin D and agrees to not increase her dose unless she discusses this with [place] first. Arla  agrees to follow-up with our clinic in 2 weeks.  At risk for osteopenia and osteoporosis [name] was given extended  (15 minutes) osteoporosis prevention counseling today. [name] is at risk for osteopenia and osteoporosis due to her Vitamin D deficiency. She was encouraged to take her Vitamin D and follow her higher calcium diet and increase strengthening exercise to help strengthen her bones and decrease her risk of osteopenia and osteoporosis.  Fatigue [name] was informed that her fatigue may be related to obesity, depression or many other causes. She will continue her iron supplement and will work on sleep hygiene.  Obesity [name] is currently in the action stage of change. As such, her goal is to continue with weight loss efforts. She has agreed to follow the Category 3 plan with additional breakfast and lunch options. She will journal 1500 calories and 90 grams of protein. We gave her the breakdown of calories and protein per meal. [name] will work on meal planning, intentional eating, and will increase her water intake. She was given Thanksgiving handout. [name] has been instructed to work up to a goal of 150 minutes of combined cardio and strengthening exercise per week for weight loss and overall health benefits. We discussed the following Behavioral Modification Strategies today: increasing lean protein intake, decreasing simple carbohydrates, increasing vegetables, increase H20 intake, decrease eating out, no skipping meals, work on meal planning and easy cooking plans, keeping healthy foods in the home, and  planning for success.  [name] has agreed to follow-up with our clinic in 2 weeks. She was informed of the importance of frequent follow-up visits to maximize her success with intensive lifestyle modifications for her multiple health conditions.  ALLERGIES: No Known Allergies  MEDICATIONS: Current Outpatient Medications on File Prior to Visit  Medication Sig Dispense Refill   ibuprofen  (ADVIL,MOTRIN) 800 MG tablet Take 1 tablet (800 mg total) by mouth every 8 (eight) hours as needed. 30 tablet 0   No current facility-administered medications on file prior to visit.     PAST MEDICAL HISTORY: Past Medical History:  Diagnosis Date   ADD (attention deficit disorder)    Breast mass, right 04/2018   Seborrheic dermatitis    Vitamin D deficiency     PAST SURGICAL HISTORY: Past Surgical History:  Procedure Laterality Date   BREAST LUMPECTOMY WITH RADIOACTIVE SEED LOCALIZATION Right 04/25/2018   Procedure: RIGHT BREAST LUMPECTOMY WITH RADIOACTIVE SEED LOCALIZATION ERAS PATHWAY;  Surgeon: Erroll Luna, MD;  Location: [place];  Service: General;  Laterality: Right;   CESAREAN SECTION  10/15/2006   CESAREAN SECTION  09/17/2008   CESAREAN SECTION     3 total   DILATION AND EVACUATION  12/10/2005   TONSILLECTOMY      SOCIAL HISTORY: Social History   Tobacco Use   Smoking status: Never Smoker   Smokeless tobacco: Never Used  Substance Use Topics   Alcohol use: Yes    Comment: occasionally   Drug use: Never    FAMILY HISTORY: Family History  Problem Relation Age of Onset   Diabetes Mother    High blood pressure Mother    Cancer Mother    Depression Mother    Anxiety disorder Mother    Bipolar disorder Mother    Drug abuse Mother    Obesity Mother    Alcoholism Father    Drug abuse Father    Obesity Father    ROS: Review of Systems  Constitutional: Positive for malaise/fatigue.  Gastrointestinal: Negative for nausea and vomiting.  Musculoskeletal:       Negative for muscle weakness.   PHYSICAL EXAM: Blood pressure 120/80, pulse 84, temperature 98.6 F (37 C), height 5\' 1"  (1.549 m), weight 179 lb (81.2 kg), last menstrual period 09/16/2019, SpO2 98 %. Body mass index is 33.82 kg/m. Physical Exam Vitals signs reviewed.  Constitutional:      Appearance: Normal appearance. She is obese.  Cardiovascular:       Rate and Rhythm: Normal rate.     Pulses: Normal pulses.  Pulmonary:     Effort: Pulmonary effort is normal.     Breath sounds: Normal breath sounds.  Musculoskeletal: Normal range of motion.  Skin:    General: Skin is warm and dry.  Neurological:     Mental Status: She is alert and oriented to person, place, and time.  Psychiatric:        Behavior: Behavior normal.   RECENT LABS AND TESTS: BMET    Component Value Date/Time   NA 138 10/10/2019 0937   K 4.3 10/10/2019 0937   CL 98 10/10/2019 0937   CO2 24 10/10/2019 0937   GLUCOSE 89 10/10/2019 0937   BUN 13 10/10/2019 0937   CREATININE 0.82 10/10/2019 0937   CALCIUM 10.1 10/10/2019 0937   GFRNONAA 89 10/10/2019 0937   GFRAA 103 10/10/2019 0937   No results found for: HGBA1C No results found for: INSULIN CBC    Component Value Date/Time   WBC  12.3 (H) 09/18/2008 0525   RBC 3.33 (L) 09/18/2008 0525   HGB 9.2 DELTA CHECK NOTED (L) 09/18/2008 0525   HCT 28.4 (L) 09/18/2008 0525   PLT 198 09/18/2008 0525   MCV 85.2 09/18/2008 0525   MCHC 32.5 09/18/2008 0525   RDW 14.9 09/18/2008 0525   Iron/TIBC/Ferritin/ %Sat No results found for: IRON, TIBC, FERRITIN, IRONPCTSAT Lipid Panel  No results found for: CHOL, TRIG, HDL, CHOLHDL, VLDL, LDLCALC, LDLDIRECT Hepatic Function Panel     Component Value Date/Time   PROT 7.2 10/10/2019 0937   ALBUMIN 4.4 10/10/2019 0937   AST 18 10/10/2019 0937   ALT 19 10/10/2019 0937   ALKPHOS 62 10/10/2019 0937   BILITOT 0.5 10/10/2019 0937      Component Value Date/Time   TSH 0.809 10/10/2019 0937   Results for Behar, Anaeli M (MRN [id number]) as of 10/28/2019 11:18  Ref. Range 10/10/2019 09:37  Vitamin D, 25-Hydroxy Latest Ref Range: 30.0 - 100.0 ng/mL 23.0 (L)   OBESITY BEHAVIORAL INTERVENTION VISIT  Today's visit was #2  Starting weight: 176 lbs Starting date: 10/10/2019 Today's weight: 179 lbs  Today's date: 10/24/2019 Total lbs lost to date: 0     10/24/2019  Height  5\' 1"  (1.549 m)  Weight 179 lb (81.2 kg)  BMI (Calculated) 33.84  BLOOD PRESSURE - SYSTOLIC [id number]  BLOOD PRESSURE - DIASTOLIC 80   Body Fat % 42 %  Total Body Water (lbs) 71.8 lbs   ASK: We discussed the diagnosis of obesity with Jackson Laveda Abbe today and Jannice agreed to give [place] permission to discuss obesity behavioral modification therapy today.  ASSESS: Aracelli has the diagnosis of obesity and her BMI today is 34.0. Tabor is in the action stage of change.   ADVISE: Chrisie was educated on the multiple health risks of obesity as well as the benefit of weight loss to improve her health. She was advised of the need for long term treatment and the importance of lifestyle modifications to improve her current health and to decrease her risk of future health problems.  AGREE: Multiple dietary modification options and treatment options were discussed and  Kandy agreed to follow the recommendations documented in the above note.  ARRANGE: Ahmari was educated on the importance of frequent visits to treat obesity as outlined per CMS and USPSTF guidelines and agreed to schedule her next follow up appointment today.  Migdalia Dk, am acting as Location manager for [employer], DO  I have reviewed the above documentation for accuracy and completeness, and I agree with the above. -Jearld Lesch, DO

## 2019-11-18 ENCOUNTER — Ambulatory Visit (INDEPENDENT_AMBULATORY_CARE_PROVIDER_SITE_OTHER): Payer: No Typology Code available for payment source | Admitting: Bariatrics

## 2019-11-18 ENCOUNTER — Other Ambulatory Visit: Payer: Self-pay

## 2019-11-18 ENCOUNTER — Encounter (INDEPENDENT_AMBULATORY_CARE_PROVIDER_SITE_OTHER): Payer: Self-pay | Admitting: Bariatrics

## 2019-11-18 VITALS — BP 108/75 | HR 73 | Temp 98.4°F | Ht 61.0 in | Wt 177.0 lb

## 2019-11-18 DIAGNOSIS — E669 Obesity, unspecified: Secondary | ICD-10-CM

## 2019-11-18 DIAGNOSIS — E559 Vitamin D deficiency, unspecified: Secondary | ICD-10-CM | POA: Diagnosis not present

## 2019-11-18 DIAGNOSIS — Z9189 Other specified personal risk factors, not elsewhere classified: Secondary | ICD-10-CM

## 2019-11-18 DIAGNOSIS — Z6833 Body mass index (BMI) 33.0-33.9, adult: Secondary | ICD-10-CM | POA: Diagnosis not present

## 2019-11-18 MED ORDER — VITAMIN D (ERGOCALCIFEROL) 1.25 MG (50000 UNIT) PO CAPS: 50000.0000 [IU] | ORAL_CAPSULE | ORAL | 0 refills | Status: DC

## 2019-11-18 MED FILL — VIT D2 1.25 MG (50,000 UNIT: 1.25 MG | 28 days supply | Qty: 4 | Fill #0

## 2019-11-18 NOTE — Progress Notes (Signed)
Office: [phone]  /  Fax: [phone]   HPI:  Chief Complaint: OBESITY Marjo is here to discuss her progress with her obesity treatment plan. She is on the Category 3 plan and states she is following her eating plan approximately 50% of the time. She states she is exercising 0 minutes 0 times per week.  Cambreigh is down 2 lbs. She reports making better choices. Her spouse is also being healthier. She states she is working on getting in adequate water.  Today's visit was #3 Starting weight: 176 lbs Starting date: 10/10/2019 Today's weight: 177 lbs Today's date: 11/18/2019 Total lbs lost to date: 0 Total lbs lost since last in-office visit: 2  Vitamin D deficiency Aminat has a diagnosis of Vitamin D deficiency. Last Vitamin D was 23.0 on 10/10/2019. She is taking prescription Vitamin D and reports increased energy.  At risk for osteopenia and osteoporosis Virgilia is at higher risk of osteopenia and osteoporosis due to Vitamin D deficiency.   ASSESSMENT AND PLAN:  Vitamin D deficiency - Plan: Vitamin D, Ergocalciferol, (DRISDOL) 1.25 MG (50000 UT) CAPS capsule  At risk for osteoporosis  Class 1 obesity with serious comorbidity and body mass index (BMI) of 33.0 to 33.9 in adult, unspecified obesity type  PLAN:  Vitamin D Deficiency Emmi was informed that low Vitamin D levels contributes to fatigue and are associated with obesity, breast, and colon cancer. She agrees to continue to take prescription Vit D @ 50,000 IU every week #4 with 0 refills and will follow-up for routine testing of Vitamin D, at least 2-3 times per year. She was informed of the risk of over-replacement of Vitamin D and agrees to not increase her dose unless she discusses this with [place] first. Susen agrees to follow-up with our clinic in 3 weeks.  At risk for osteopenia and osteoporosis Atlantis was given extended  (15 minutes) osteoporosis prevention counseling today. Shaquisha is at risk for osteopenia and  osteoporosis due to her Vitamin D deficiency. She was encouraged to take her Vitamin D and follow her higher calcium diet and increase strengthening exercise to help strengthen her bones and decrease her risk of osteopenia and osteoporosis.  Obesity Junice is currently in the action stage of change. As such, her goal is to continue with weight loss efforts. She has agreed to follow the Category 3 plan. Shalen will work on [employer], will make better choices, and will increase her water intake. Johnie has been instructed to continue walking 10,000 steps at work and increase activities for weight loss and overall health benefits. We discussed the following Behavioral Modification Strategies today: increasing lean protein intake, decreasing simple carbohydrates, increasing vegetables, increase H20 intake, decrease eating out, no skipping meals, work on meal planning and easy cooking plans, keeping healthy foods in the home, and planning for success.  Mistina has agreed to follow-up with our clinic in 3 weeks. She was informed of the importance of frequent follow-up visits to maximize her success with intensive lifestyle modifications for her multiple health conditions.  ALLERGIES: No Known Allergies  MEDICATIONS: Current Outpatient Medications on File Prior to Visit  Medication Sig Dispense Refill  . ibuprofen (ADVIL,MOTRIN) 800 MG tablet Take 1 tablet (800 mg total) by mouth every 8 (eight) hours as needed. 30 tablet 0   No current facility-administered medications on file prior to visit.    PAST MEDICAL HISTORY: Past Medical History:  Diagnosis Date  . ADD (attention deficit disorder)   . Breast mass, right 04/2018  .  Seborrheic dermatitis   . Vitamin D deficiency     PAST SURGICAL HISTORY: Past Surgical History:  Procedure Laterality Date  . BREAST LUMPECTOMY WITH RADIOACTIVE SEED LOCALIZATION Right 04/25/2018   Procedure: RIGHT BREAST LUMPECTOMY WITH RADIOACTIVE SEED LOCALIZATION  ERAS PATHWAY;  Surgeon: Erroll Luna, MD;  Location: [place];  Service: General;  Laterality: Right;  . CESAREAN SECTION  10/15/2006  . CESAREAN SECTION  09/17/2008  . CESAREAN SECTION     3 total  . DILATION AND EVACUATION  12/10/2005  . TONSILLECTOMY      SOCIAL HISTORY: Social History   Tobacco Use  . Smoking status: Never Smoker  . Smokeless tobacco: Never Used  Substance Use Topics  . Alcohol use: Yes    Comment: occasionally  . Drug use: Never    FAMILY HISTORY: Family History  Problem Relation Age of Onset  . Diabetes Mother   . High blood pressure Mother   . Cancer Mother   . Depression Mother   . Anxiety disorder Mother   . Bipolar disorder Mother   . Drug abuse Mother   . Obesity Mother   . Alcoholism Father   . Drug abuse Father   . Obesity Father    ROS: Review of Systems  Constitutional:       Positive for increased energy.   PHYSICAL EXAM: Blood pressure 108/75, pulse 73, temperature 98.4 F (36.9 C), height 5\' 1"  (1.549 m), weight 177 lb (80.3 kg), last menstrual period 11/05/2019, SpO2 98 %. Body mass index is 33.44 kg/m. Physical Exam Vitals reviewed.  Constitutional:      Appearance: Normal appearance. She is obese.  Cardiovascular:     Rate and Rhythm: Normal rate.     Pulses: Normal pulses.  Pulmonary:     Effort: Pulmonary effort is normal.     Breath sounds: Normal breath sounds.  Musculoskeletal:        General: Normal range of motion.  Skin:    General: Skin is warm and dry.  Neurological:     Mental Status: She is alert and oriented to person, place, and time.  Psychiatric:        Behavior: Behavior normal.   RECENT LABS AND TESTS: BMET    Component Value Date/Time   NA 138 10/10/2019 0937   K 4.3 10/10/2019 0937   CL 98 10/10/2019 0937   CO2 24 10/10/2019 0937   GLUCOSE 89 10/10/2019 0937   BUN 13 10/10/2019 0937   CREATININE 0.82 10/10/2019 0937   CALCIUM 10.1 10/10/2019 0937   GFRNONAA 89  10/10/2019 0937   GFRAA 103 10/10/2019 0937   No results found for: HGBA1C No results found for: INSULIN CBC    Component Value Date/Time   WBC 12.3 (H) 09/18/2008 0525   RBC 3.33 (L) 09/18/2008 0525   HGB 9.2 DELTA CHECK NOTED (L) 09/18/2008 0525   HCT 28.4 (L) 09/18/2008 0525   PLT 198 09/18/2008 0525   MCV 85.2 09/18/2008 0525   MCHC 32.5 09/18/2008 0525   RDW 14.9 09/18/2008 0525   Iron/TIBC/Ferritin/ %Sat No results found for: IRON, TIBC, FERRITIN, IRONPCTSAT Lipid Panel  No results found for: CHOL, TRIG, HDL, CHOLHDL, VLDL, LDLCALC, LDLDIRECT Hepatic Function Panel     Component Value Date/Time   PROT 7.2 10/10/2019 0937   ALBUMIN 4.4 10/10/2019 0937   AST 18 10/10/2019 0937   ALT 19 10/10/2019 0937   ALKPHOS 62 10/10/2019 0937   BILITOT 0.5 10/10/2019 [id number]  Component Value Date/Time   TSH 0.809 10/10/2019 0937    OBESITY BEHAVIORAL INTERVENTION VISIT DOCUMENTATION FOR INSURANCE (~15 minutes)  I, Michaelene Song, am acting as Location manager for [employer], DO  I have reviewed the above documentation for accuracy and completeness, and I agree with the above. -Jearld Lesch, DO

## 2019-11-19 ENCOUNTER — Encounter (INDEPENDENT_AMBULATORY_CARE_PROVIDER_SITE_OTHER): Payer: Self-pay | Admitting: Bariatrics

## 2019-12-09 ENCOUNTER — Encounter (INDEPENDENT_AMBULATORY_CARE_PROVIDER_SITE_OTHER): Payer: Self-pay | Admitting: Family Medicine

## 2019-12-09 ENCOUNTER — Ambulatory Visit (INDEPENDENT_AMBULATORY_CARE_PROVIDER_SITE_OTHER): Payer: No Typology Code available for payment source | Admitting: Family Medicine

## 2019-12-09 ENCOUNTER — Other Ambulatory Visit: Payer: Self-pay

## 2019-12-09 VITALS — BP 126/86 | HR 93 | Temp 98.2°F | Ht 61.0 in | Wt 177.0 lb

## 2019-12-09 DIAGNOSIS — Z9189 Other specified personal risk factors, not elsewhere classified: Secondary | ICD-10-CM

## 2019-12-09 DIAGNOSIS — E669 Obesity, unspecified: Secondary | ICD-10-CM | POA: Diagnosis not present

## 2019-12-09 DIAGNOSIS — E559 Vitamin D deficiency, unspecified: Secondary | ICD-10-CM | POA: Diagnosis not present

## 2019-12-09 DIAGNOSIS — Z6833 Body mass index (BMI) 33.0-33.9, adult: Secondary | ICD-10-CM | POA: Diagnosis not present

## 2019-12-09 MED ORDER — VITAMIN D (ERGOCALCIFEROL) 1.25 MG (50000 UNIT) PO CAPS: 50000.0000 [IU] | ORAL_CAPSULE | ORAL | 0 refills | Status: DC

## 2019-12-09 NOTE — Progress Notes (Signed)
Chief Complaint: OBESITY [name] is here to discuss her progress with her obesity treatment plan. [name] is on the Category 3 Plan and states she is following her eating plan approximately 90% of the time. [name] states she is using her Bowflex and walking at work for 15 minutes 5 times per week.  Today's visit was #: 4 Starting weight: 176 lbs Starting date: 10/10/2019 Today's weight: 177 lbs Today's date: 12/09/2019 Total lbs lost to date: 0 lbs Total lbs lost since last in-office visit: 0 lbs  Subjective:   Interim History: [name] has been following the plan well.  She changed from [place] milk to A2 milk, which has the same number of calories but less protein.  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  Assessment/Plan:     ICD-10-CM   1. Vitamin D deficiency  E55.9 Vitamin D, Ergocalciferol, (DRISDOL) 1.25 MG (50000 UT) CAPS capsule  2. At risk for osteoporosis  Z91.89   3. Class 1 obesity with serious comorbidity and body mass index (BMI) of 33.0 to 33.9 in adult, unspecified obesity type  E66.9    Z68.33    1. Vitamin D deficiency Low Vitamin D level contributes to fatigue and are associated with obesity, breast, and colon cancer. She agrees to continue to take prescription Vitamin D @50 ,000 IU every week and will follow-up for routine testing of vitamin D, at least 2-3 times per year to avoid over-replacement.  Orders -Vitamin D 50,000 IU weekly, #4, 0 refills. She agrees to follow-up with our clinic in 2 weeks.  2. At risk for osteoporosis [name] was given extended (15 minutes) osteoporosis prevention counseling today. [name] is at risk for osteopenia and osteoporosis due to her Vitamin D deficiency. She was encouraged to take her Vitamin D and follow her higher calcium diet and increase strengthening exercise to help strengthen her bones and decrease her risk of osteopenia and osteoporosis.  3. Class 1 obesity with  serious comorbidity and body mass index (BMI) of 33.0 to 33.9 in adult, unspecified obesity type [name] is currently in the action stage of change. As such, her goal is to continue with weight loss efforts. She has agreed to Category 3 Plan. We discussed the following exercise goals today: For substantial health benefits, adults should do at least 150 minutes (2 hours and 30 minutes) a week of moderate-intensity, or 75 minutes (1 hour and 15 minutes) a week of vigorous-intensity aerobic physical activity, or an equivalent combination of moderate- and vigorous-intensity aerobic activity. Aerobic activity should be performed in episodes of at least 10 minutes, and preferably, it should be spread throughout the week. Adults should also include muscle-strengthening activities that involve all major muscle groups on 2 or more days a week.   We discussed the following behavioral modification strategies today: increasing lean protein intake and increasing water intake.  [name] has agreed to follow-up with our clinic in 2 weeks. She was informed of the importance of frequent follow-up visits to maximize her success with intensive lifestyle modifications for her multiple health conditions.  Objective:   Blood pressure 126/86, pulse 93, temperature 98.2 F (36.8 C), temperature source Oral, height 5\' 1"  (1.549 m), weight 177 lb (80.3 kg), SpO2 97 %. Body mass index is 33.44 kg/m.  General: Cooperative, alert, well developed, in no acute distress. HEENT: Conjunctivae and lids unremarkable. Neck: No thyromegaly.  Cardiovascular: Regular rhythm.  Lungs: Normal work of breathing.  Extremities: No edema.  Neurologic: No focal deficits.   Lab Results  Component Value Date   CREATININE 0.82 10/10/2019   BUN 13 10/10/2019   NA 138 10/10/2019   K 4.3 10/10/2019   CL 98 10/10/2019   CO2 24 10/10/2019   Lab Results  Component Value Date   ALT 19 10/10/2019   AST 18 10/10/2019   ALKPHOS 62  10/10/2019   BILITOT 0.5 10/10/2019   No results found for: HGBA1C No results found for: INSULIN Lab Results  Component Value Date   TSH 0.809 10/10/2019   No results found for: CHOL, HDL, LDLCALC, LDLDIRECT, TRIG, CHOLHDL   Lab Results  Component Value Date   WBC 12.3 (H) 09/18/2008   HGB 9.2 DELTA CHECK NOTED (L) 09/18/2008   HCT 28.4 (L) 09/18/2008   MCV 85.2 09/18/2008   PLT 198 09/18/2008   Attestation Statements:   Reviewed by clinician on day of visit: allergies, medications, problem list, medical history, surgical history, family history, social history and previous encounter notes.  This visit occurred during the SARS-CoV-2 public health emergency. Safety protocols were in place, including screening questions prior to the visit, additional usage of staff PPE, and extensive cleaning of exam room while observing appropriate contact time as indicated for disinfecting solutions. (CPT [id number])  I, Water quality scientist, am acting as Location manager for [employer], DO.  I have reviewed the above documentation for accuracy and completeness, and I agree with the above. Briscoe Deutscher, DO   Time spent on visit including pre-visit chart review and post-visit care was 35 minutes.

## 2019-12-10 MED FILL — VIT D2 1.25 MG (50,000 UNIT: 1.25 MG | 28 days supply | Qty: 4 | Fill #0

## 2019-12-19 MED FILL — VIT D2 1.25 MG (50,000 UNIT: 1.25 MG | 28 days supply | Qty: 4 | Fill #0 | Status: TO

## 2019-12-19 MED FILL — VIT D2 1.25 MG (50,000 UNIT: 1.25 MG | 28 days supply | Qty: 4 | Fill #0

## 2019-12-26 ENCOUNTER — Encounter (INDEPENDENT_AMBULATORY_CARE_PROVIDER_SITE_OTHER): Payer: Self-pay | Admitting: Bariatrics

## 2019-12-26 ENCOUNTER — Ambulatory Visit (INDEPENDENT_AMBULATORY_CARE_PROVIDER_SITE_OTHER): Payer: No Typology Code available for payment source | Admitting: Bariatrics

## 2019-12-26 ENCOUNTER — Other Ambulatory Visit: Payer: Self-pay

## 2019-12-26 ENCOUNTER — Ambulatory Visit (INDEPENDENT_AMBULATORY_CARE_PROVIDER_SITE_OTHER): Payer: No Typology Code available for payment source | Admitting: Family Medicine

## 2019-12-26 VITALS — BP 133/95 | HR 69 | Temp 98.6°F | Ht 61.0 in | Wt 176.0 lb

## 2019-12-26 DIAGNOSIS — Z6833 Body mass index (BMI) 33.0-33.9, adult: Secondary | ICD-10-CM | POA: Diagnosis not present

## 2019-12-26 DIAGNOSIS — E669 Obesity, unspecified: Secondary | ICD-10-CM | POA: Diagnosis not present

## 2019-12-26 DIAGNOSIS — E559 Vitamin D deficiency, unspecified: Secondary | ICD-10-CM | POA: Diagnosis not present

## 2019-12-30 NOTE — Progress Notes (Signed)
Chief Complaint:   OBESITY [name] is here to discuss her progress with her obesity treatment plan along with follow-up of her obesity related diagnoses. [name] is on the Category 3 Plan and states she is following her eating plan approximately 99% of the time. 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  Today's visit was #: 5 Starting weight: 176 lbs Starting date: 10/10/2019 Today's weight: 176 lbs Today's date: 12/26/2019 Total lbs lost to date: 0 Total lbs lost since last in-office visit: 1  Interim History: [name] is down 1 lbs. She is struggling with bread in the morning. She states that dinner is the most challenging. She is not able to get enough water in due to wearing the mask.  Subjective:   1. Vitamin D deficiency [name] is taking Vit D and denies side effects. Last Vit D level was 23.0 on 10/10/2019. She denies nausea, vomiting, or muscle weakness.  Assessment/Plan:   1. Vitamin D deficiency Low Vitamin D level contributes to fatigue and are associated with obesity, breast, and colon cancer. [name] agreed to continue taking prescription Vitamin D 50,000 IU every week and will follow-up for routine testing of Vitamin D, at least 2-3 times per year to avoid over-replacement. We will continue to monitor.  2. Class 1 obesity with serious comorbidity and body mass index (BMI) of 33.0 to 33.9 in adult, unspecified obesity type [name] is currently in the action stage of change. As such, her goal is to continue with weight loss efforts. She has agreed to the Category 3 Plan.   [name] will try to drink water in the morning and at lunch.  Exercise goals: For substantial health benefits, adults should do at least 150 minutes (2 hours and 30 minutes) a week of moderate-intensity, or 75 minutes (1 hour and 15 minutes) a week of vigorous-intensity aerobic physical activity, or an equivalent combination of moderate- and vigorous-intensity aerobic activity. Aerobic  activity should be performed in episodes of at least 10 minutes, and preferably, it should be spread throughout the week. Adults should also include muscle-strengthening activities that involve all major muscle groups on 2 or more days a week.  Behavioral modification strategies: increasing lean protein intake, decreasing simple carbohydrates, increasing vegetables, increasing water intake, decreasing eating out, no skipping meals, meal planning and cooking strategies and keeping healthy foods in the home.  [name] has agreed to follow-up with our clinic in 2 weeks. She was informed of the importance of frequent follow-up visits to maximize her success with intensive lifestyle modifications for her multiple health conditions.   Objective:   Blood pressure (!) 133/95, pulse 69, temperature 98.6 F (37 C), height 5\' 1"  (1.549 m), weight 176 lb (79.8 kg), SpO2 98 %. Body mass index is 33.25 kg/m.  General: Cooperative, alert, well developed, in no acute distress. HEENT: Conjunctivae and lids unremarkable. Cardiovascular: Regular rhythm.  Lungs: Normal work of breathing. Neurologic: No focal deficits.   Lab Results  Component Value Date   CREATININE 0.82 10/10/2019   BUN 13 10/10/2019   NA 138 10/10/2019   K 4.3 10/10/2019   CL 98 10/10/2019   CO2 24 10/10/2019   Lab Results  Component Value Date   ALT 19 10/10/2019   AST 18 10/10/2019   ALKPHOS 62 10/10/2019   BILITOT 0.5 10/10/2019   No results found for: HGBA1C No results found for: INSULIN Lab Results  Component Value Date   TSH 0.809 10/10/2019  No results found for: CHOL, HDL, LDLCALC, LDLDIRECT, TRIG, CHOLHDL Lab Results  Component Value Date   WBC 12.3 (H) 09/18/2008   HGB 9.2 DELTA CHECK NOTED (L) 09/18/2008   HCT 28.4 (L) 09/18/2008   MCV 85.2 09/18/2008   PLT 198 09/18/2008   No results found for: IRON, TIBC, FERRITIN  Attestation Statements:   Reviewed by clinician on day of visit: allergies, medications,  problem list, medical history, surgical history, family history, social history, and previous encounter notes.  Time spent on visit including pre-visit chart review and post-visit care was 20 minutes.   Wilhemena Durie, am acting as Location manager for [employer], DO.  I have reviewed the above documentation for accuracy and completeness, and I agree with the above. Jearld Lesch, DO

## 2020-01-27 ENCOUNTER — Encounter (INDEPENDENT_AMBULATORY_CARE_PROVIDER_SITE_OTHER): Payer: Self-pay | Admitting: Family Medicine

## 2020-01-28 ENCOUNTER — Other Ambulatory Visit (INDEPENDENT_AMBULATORY_CARE_PROVIDER_SITE_OTHER): Payer: Self-pay

## 2020-01-28 DIAGNOSIS — E559 Vitamin D deficiency, unspecified: Secondary | ICD-10-CM

## 2020-01-28 MED ORDER — VITAMIN D (ERGOCALCIFEROL) 1.25 MG (50000 UNIT) PO CAPS: 50000.0000 [IU] | ORAL_CAPSULE | ORAL | 0 refills | Status: DC

## 2020-01-28 MED FILL — VIT D2 1.25 MG (50,000 UNIT: 1.25 MG | 28 days supply | Qty: 4 | Fill #0

## 2020-02-27 ENCOUNTER — Ambulatory Visit (INDEPENDENT_AMBULATORY_CARE_PROVIDER_SITE_OTHER): Payer: No Typology Code available for payment source | Admitting: Family Medicine

## 2020-02-28 MED FILL — PANTOPRAZOLE SOD DR 40 MG T: 40 | 90 days supply | Qty: 90 | Fill #0

## 2020-07-03 ENCOUNTER — Other Ambulatory Visit: Payer: Self-pay | Admitting: Family Medicine

## 2020-07-03 MED ORDER — AZITHROMYCIN 250 MG PO TABS: 250.0000 mg | ORAL_TABLET | Freq: Every day | ORAL | 0 refills | Status: DC

## 2020-07-03 MED FILL — AZITHROMYCIN 250 MG TABS: 250 | 5 days supply | Qty: 6 | Fill #0

## 2020-08-25 ENCOUNTER — Other Ambulatory Visit: Payer: Self-pay | Admitting: Occupational Medicine

## 2020-08-25 ENCOUNTER — Ambulatory Visit: Payer: Self-pay

## 2020-08-25 ENCOUNTER — Other Ambulatory Visit: Payer: Self-pay

## 2020-08-25 DIAGNOSIS — M25532 Pain in left wrist: Secondary | ICD-10-CM

## 2020-09-10 ENCOUNTER — Other Ambulatory Visit (HOSPITAL_COMMUNITY): Payer: Self-pay | Admitting: Orthopedic Surgery

## 2020-09-10 MED FILL — MELOXICAM 15 MG TABLET: 15 | 30 days supply | Qty: 30 | Fill #0

## 2020-10-07 MED FILL — MELOXICAM 15 MG TABLET: 15 | 30 days supply | Qty: 30 | Fill #0

## 2020-11-26 ENCOUNTER — Ambulatory Visit: Payer: No Typology Code available for payment source | Attending: Internal Medicine

## 2020-11-26 ENCOUNTER — Other Ambulatory Visit (HOSPITAL_COMMUNITY): Payer: Self-pay | Admitting: Unknown Physician Specialty

## 2020-11-26 DIAGNOSIS — Z23 Encounter for immunization: Secondary | ICD-10-CM

## 2020-11-26 MED FILL — PHENTERMINE 37.5 MG TABLET: 37.5 | 30 days supply | Qty: 30 | Fill #0

## 2020-11-26 NOTE — Progress Notes (Signed)
   Covid-19 Vaccination Clinic  Name:  [name]    MRN: [id number] DOB: [birth year]  11/26/2020  [name] was observed post Covid-19 immunization for 15 minutes without incident. She was provided with Vaccine Information Sheet and instruction to access the V-Safe system.   [name] was instructed to call 911 with any severe reactions post vaccine: Marland Kitchen Difficulty breathing  . Swelling of face and throat  . A fast heartbeat  . A bad rash all over body  . Dizziness and weakness   Immunizations Administered    Name Date Dose VIS Date Route   Moderna Covid-19 Booster Vaccine 11/26/2020  5:42 PM 0.25 mL 09/23/2020 Intramuscular   Manufacturer: Levan Hurst   Lot: [id number]   [place]: [id number]

## 2020-12-07 DIAGNOSIS — Z30431 Encounter for routine checking of intrauterine contraceptive device: Secondary | ICD-10-CM | POA: Diagnosis not present

## 2020-12-07 DIAGNOSIS — Z01419 Encounter for gynecological examination (general) (routine) without abnormal findings: Secondary | ICD-10-CM | POA: Diagnosis not present

## 2020-12-18 DIAGNOSIS — Z03818 Encounter for observation for suspected exposure to other biological agents ruled out: Secondary | ICD-10-CM | POA: Diagnosis not present

## 2020-12-18 DIAGNOSIS — Z20822 Contact with and (suspected) exposure to covid-19: Secondary | ICD-10-CM | POA: Diagnosis not present

## 2021-02-25 DIAGNOSIS — Z20822 Contact with and (suspected) exposure to covid-19: Secondary | ICD-10-CM | POA: Diagnosis not present

## 2021-02-25 DIAGNOSIS — Z03818 Encounter for observation for suspected exposure to other biological agents ruled out: Secondary | ICD-10-CM | POA: Diagnosis not present

## 2021-04-08 ENCOUNTER — Other Ambulatory Visit (HOSPITAL_COMMUNITY): Payer: Self-pay

## 2021-04-08 DIAGNOSIS — R300 Dysuria: Secondary | ICD-10-CM | POA: Diagnosis not present

## 2021-04-08 DIAGNOSIS — R0982 Postnasal drip: Secondary | ICD-10-CM | POA: Diagnosis not present

## 2021-04-08 DIAGNOSIS — E559 Vitamin D deficiency, unspecified: Secondary | ICD-10-CM | POA: Diagnosis not present

## 2021-04-08 DIAGNOSIS — R739 Hyperglycemia, unspecified: Secondary | ICD-10-CM | POA: Diagnosis not present

## 2021-04-08 DIAGNOSIS — Z13220 Encounter for screening for lipoid disorders: Secondary | ICD-10-CM | POA: Diagnosis not present

## 2021-04-08 DIAGNOSIS — Z0000 Encounter for general adult medical examination without abnormal findings: Secondary | ICD-10-CM | POA: Diagnosis not present

## 2021-04-08 DIAGNOSIS — E663 Overweight: Secondary | ICD-10-CM | POA: Diagnosis not present

## 2021-04-08 DIAGNOSIS — D500 Iron deficiency anemia secondary to blood loss (chronic): Secondary | ICD-10-CM | POA: Diagnosis not present

## 2021-04-08 MED ORDER — IPRATROPIUM BROMIDE 0.03 % NA SOLN
NASAL | 3 refills | Status: DC
  Filled 2021-04-08: qty 30, 43d supply, fill #0

## 2021-04-10 ENCOUNTER — Other Ambulatory Visit: Payer: Self-pay

## 2021-04-19 DIAGNOSIS — M4125 Other idiopathic scoliosis, thoracolumbar region: Secondary | ICD-10-CM | POA: Diagnosis not present

## 2021-04-19 DIAGNOSIS — M47815 Spondylosis without myelopathy or radiculopathy, thoracolumbar region: Secondary | ICD-10-CM | POA: Diagnosis not present

## 2021-04-19 DIAGNOSIS — M4124 Other idiopathic scoliosis, thoracic region: Secondary | ICD-10-CM | POA: Diagnosis not present

## 2021-04-19 DIAGNOSIS — M5135 Other intervertebral disc degeneration, thoracolumbar region: Secondary | ICD-10-CM | POA: Diagnosis not present

## 2021-04-19 DIAGNOSIS — M47814 Spondylosis without myelopathy or radiculopathy, thoracic region: Secondary | ICD-10-CM | POA: Diagnosis not present

## 2021-04-19 DIAGNOSIS — M5117 Intervertebral disc disorders with radiculopathy, lumbosacral region: Secondary | ICD-10-CM | POA: Diagnosis not present

## 2021-04-19 DIAGNOSIS — M9902 Segmental and somatic dysfunction of thoracic region: Secondary | ICD-10-CM | POA: Diagnosis not present

## 2021-04-19 DIAGNOSIS — M9903 Segmental and somatic dysfunction of lumbar region: Secondary | ICD-10-CM | POA: Diagnosis not present

## 2021-04-19 DIAGNOSIS — M5134 Other intervertebral disc degeneration, thoracic region: Secondary | ICD-10-CM | POA: Diagnosis not present

## 2021-04-20 DIAGNOSIS — M9903 Segmental and somatic dysfunction of lumbar region: Secondary | ICD-10-CM | POA: Diagnosis not present

## 2021-04-20 DIAGNOSIS — M5117 Intervertebral disc disorders with radiculopathy, lumbosacral region: Secondary | ICD-10-CM | POA: Diagnosis not present

## 2021-04-20 DIAGNOSIS — M47815 Spondylosis without myelopathy or radiculopathy, thoracolumbar region: Secondary | ICD-10-CM | POA: Diagnosis not present

## 2021-04-20 DIAGNOSIS — M5134 Other intervertebral disc degeneration, thoracic region: Secondary | ICD-10-CM | POA: Diagnosis not present

## 2021-04-20 DIAGNOSIS — M47814 Spondylosis without myelopathy or radiculopathy, thoracic region: Secondary | ICD-10-CM | POA: Diagnosis not present

## 2021-04-20 DIAGNOSIS — M5135 Other intervertebral disc degeneration, thoracolumbar region: Secondary | ICD-10-CM | POA: Diagnosis not present

## 2021-04-20 DIAGNOSIS — M4125 Other idiopathic scoliosis, thoracolumbar region: Secondary | ICD-10-CM | POA: Diagnosis not present

## 2021-04-20 DIAGNOSIS — M4124 Other idiopathic scoliosis, thoracic region: Secondary | ICD-10-CM | POA: Diagnosis not present

## 2021-04-20 DIAGNOSIS — M9902 Segmental and somatic dysfunction of thoracic region: Secondary | ICD-10-CM | POA: Diagnosis not present

## 2021-04-26 DIAGNOSIS — M4124 Other idiopathic scoliosis, thoracic region: Secondary | ICD-10-CM | POA: Diagnosis not present

## 2021-04-26 DIAGNOSIS — M5135 Other intervertebral disc degeneration, thoracolumbar region: Secondary | ICD-10-CM | POA: Diagnosis not present

## 2021-04-26 DIAGNOSIS — M9903 Segmental and somatic dysfunction of lumbar region: Secondary | ICD-10-CM | POA: Diagnosis not present

## 2021-04-26 DIAGNOSIS — M5117 Intervertebral disc disorders with radiculopathy, lumbosacral region: Secondary | ICD-10-CM | POA: Diagnosis not present

## 2021-04-26 DIAGNOSIS — M9902 Segmental and somatic dysfunction of thoracic region: Secondary | ICD-10-CM | POA: Diagnosis not present

## 2021-04-26 DIAGNOSIS — M47814 Spondylosis without myelopathy or radiculopathy, thoracic region: Secondary | ICD-10-CM | POA: Diagnosis not present

## 2021-04-26 DIAGNOSIS — M5134 Other intervertebral disc degeneration, thoracic region: Secondary | ICD-10-CM | POA: Diagnosis not present

## 2021-04-26 DIAGNOSIS — M47815 Spondylosis without myelopathy or radiculopathy, thoracolumbar region: Secondary | ICD-10-CM | POA: Diagnosis not present

## 2021-04-26 DIAGNOSIS — M4125 Other idiopathic scoliosis, thoracolumbar region: Secondary | ICD-10-CM | POA: Diagnosis not present

## 2021-08-18 ENCOUNTER — Ambulatory Visit: Payer: No Typology Code available for payment source | Admitting: Family Medicine

## 2021-09-08 ENCOUNTER — Ambulatory Visit: Payer: No Typology Code available for payment source | Admitting: Family Medicine

## 2021-09-10 NOTE — Progress Notes (Signed)
[name] Phone: [phone] Subjective:   [name], am serving as a scribe for Dr. Hulan Saas. This visit occurred during the SARS-CoV-2 public health emergency.  Safety protocols were in place, including screening questions prior to the visit, additional usage of staff PPE, and extensive cleaning of exam room while observing appropriate contact time as indicated for disinfecting solutions.   I'm seeing this patient by the request  of:  Vernie Shanks, MD  CC: Low back pain  [id number]  [name] is a 45 y.o. female coming in with complaint of back pain that began 6 months ago. Pain in L lumbar spine. 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 Cart veers to the right so she pushes more with her left. Has requested ergonomic assessment. This occurred last week and they recommended new carts. Patient uses heated seats and heated blanket for pain relief. Pain has improved over past month but still gets sore after her shift. Patient has tried chiropractic and did not care for that treatment.       Past Medical History:  Diagnosis Date   ADD (attention deficit disorder)    Breast mass, right 04/2018   Seborrheic dermatitis    Vitamin D deficiency    Past Surgical History:  Procedure Laterality Date   BREAST LUMPECTOMY WITH RADIOACTIVE SEED LOCALIZATION Right 04/25/2018   Procedure: RIGHT BREAST LUMPECTOMY WITH RADIOACTIVE SEED LOCALIZATION ERAS PATHWAY;  Surgeon: Erroll Luna, MD;  Location: [facility];  Service: General;  Laterality: Right;   CESAREAN SECTION  10/15/2006   CESAREAN SECTION  09/17/2008   CESAREAN SECTION     3 total   DILATION AND EVACUATION  12/10/2005   TONSILLECTOMY     Social History   Socioeconomic History   Marital status: Married    Spouse name: Programme researcher, broadcasting/film/video   Number of children: Not on file   Years of education: Not on file   Highest education level: Not  on file  Occupational History   Occupation: Therapist, sports labor & delivery  Tobacco Use   Smoking status: Never   Smokeless tobacco: Never  Vaping Use   Vaping Use: Never used  Substance and Sexual Activity   Alcohol use: Yes    Comment: occasionally   Drug use: Never   Sexual activity: Not on file  Other Topics Concern   Not on file  Social History Narrative   Not on file   Social Determinants of Health   Financial Resource Strain: Not on file  Food Insecurity: Not on file  Transportation Needs: Not on file  Physical Activity: Not on file  Stress: Not on file  Social Connections: Not on file   Douglas Known Allergies Family History  Problem Relation Age of Onset   Diabetes Mother    High blood pressure Mother    Cancer Mother    Depression Mother    Anxiety disorder Mother    Bipolar disorder Mother    Drug abuse Mother    Obesity Mother    Alcoholism Father    Drug abuse Father    Obesity Father       Current Outpatient Medications (Respiratory):    ipratropium (ATROVENT) 0.03 % nasal spray, Use 2 sprays in each nostril Twice a day    Current Outpatient Medications (Other):    azithromycin (ZITHROMAX) 250 MG tablet, Take 1 tablet (250 mg total) by mouth daily. 2 tab  day 1, then 1 tab daily   Vitamin D, Ergocalciferol, (DRISDOL) 1.25 MG (50000 UNIT) CAPS capsule, Take 1 capsule (50,000 Units total) by mouth every 7 (seven) days.   phentermine (ADIPEX-P) 37.5 MG tablet, TAKE 1 TABLET BY MOUTH ONCE A DAY   Reviewed prior external information including notes and imaging from  primary care provider As well as notes that were available from care everywhere and other healthcare systems.  Past medical history, social, surgical and family history all reviewed in electronic medical record.  Douglas pertanent information unless stated regarding to the chief complaint.   Review of Systems:  Douglas headache, visual changes, nausea, vomiting, diarrhea, constipation, dizziness, abdominal  pain, skin rash, fevers, chills, night sweats, weight loss, swollen lymph nodes, body aches, joint swelling, chest pain, shortness of breath, mood changes. POSITIVE muscle aches  Objective  Blood pressure 110/84, pulse 64, height 5\' 1"  (1.549 m), weight 174 lb (78.9 kg), SpO2 98 %.   General: Douglas apparent distress alert and oriented x3 mood and affect normal, dressed appropriately.  HEENT: Pupils equal, extraocular movements intact  Respiratory: Patient's speak in full sentences and does not appear short of breath  Cardiovascular: Douglas lower extremity edema, non tender, Douglas erythema  Gait normal with good balance and coordination.  MSK: Low back exam does have some loss of lordosis.  Patient does have some poor core strength noted.  Patient does have tightness with FABER test right greater than left. Negative straight leg test.  Neurovascular intact distally.  Tightness in the thoracolumbar juncture noted as well.  [id number]; 15 additional minutes spent for Therapeutic exercises as stated in above notes.  This included exercises focusing on stretching, strengthening, with significant focus on eccentric aspects.   Long term goals include an improvement in range of motion, strength, endurance as well as avoiding reinjury. Patient's frequency would include in 1-2 times a day, 3-5 times a week for a duration of 6-12 weeks. Low back exercises that included:  Pelvic tilt/bracing instruction to focus on control of the pelvic girdle and lower abdominal muscles  Glute strengthening exercises, focusing on proper firing of the glutes without engaging the low back muscles Proper stretching techniques for maximum relief for the hamstrings, hip flexors, low back and some rotation where tolerated  Proper technique shown and discussed handout in great detail with ATC.  All questions were discussed and answered.     Impression and Recommendations:    The above documentation has been reviewed and is accurate and  complete Lyndal Pulley, DO

## 2021-09-13 ENCOUNTER — Ambulatory Visit (INDEPENDENT_AMBULATORY_CARE_PROVIDER_SITE_OTHER): Payer: 59 | Admitting: Family Medicine

## 2021-09-13 ENCOUNTER — Other Ambulatory Visit: Payer: Self-pay

## 2021-09-13 ENCOUNTER — Ambulatory Visit (INDEPENDENT_AMBULATORY_CARE_PROVIDER_SITE_OTHER): Payer: 59

## 2021-09-13 VITALS — BP 110/84 | HR 64 | Ht 61.0 in | Wt 174.0 lb

## 2021-09-13 DIAGNOSIS — G8929 Other chronic pain: Secondary | ICD-10-CM | POA: Diagnosis not present

## 2021-09-13 DIAGNOSIS — M545 Low back pain, unspecified: Secondary | ICD-10-CM | POA: Diagnosis not present

## 2021-09-13 NOTE — Patient Instructions (Signed)
Xray today Vit D 2000IU Turmeric 500mg  daily Probiotic 10 diff strains and 10 billion units See me again in 4-6 weeks

## 2021-09-14 ENCOUNTER — Encounter: Payer: Self-pay | Admitting: Family Medicine

## 2021-09-14 DIAGNOSIS — M545 Low back pain, unspecified: Secondary | ICD-10-CM | POA: Insufficient documentation

## 2021-09-14 NOTE — Assessment & Plan Note (Signed)
Patient does have low back pain.  Seems to be multifactorial.  I do believe that patient does have strength with some tightness of the hip flexor that is likely contributing to a decent amount of the discomfort.  Discussed with patient icing regimen and home exercises.  Discussed which activities to do and which ones to avoid.  Increase activity slowly.  Patient work with Product/process development scientist to learn home exercises.  Patient has had some difficulty with her stomach as well that could be contributing Discussed potential probiotic.  Encouraged weight loss.  We will follow-up again in 4 to 8 weeks.

## 2021-10-12 ENCOUNTER — Ambulatory Visit: Payer: No Typology Code available for payment source | Admitting: Family Medicine

## 2021-10-13 ENCOUNTER — Other Ambulatory Visit (HOSPITAL_COMMUNITY): Payer: Self-pay

## 2021-10-13 DIAGNOSIS — D1801 Hemangioma of skin and subcutaneous tissue: Secondary | ICD-10-CM | POA: Diagnosis not present

## 2021-10-13 DIAGNOSIS — D225 Melanocytic nevi of trunk: Secondary | ICD-10-CM | POA: Diagnosis not present

## 2021-10-13 DIAGNOSIS — L218 Other seborrheic dermatitis: Secondary | ICD-10-CM | POA: Diagnosis not present

## 2021-10-13 DIAGNOSIS — L858 Other specified epidermal thickening: Secondary | ICD-10-CM | POA: Diagnosis not present

## 2021-10-13 MED ORDER — TRETINOIN 0.025 % EX CREA
TOPICAL_CREAM | CUTANEOUS | 3 refills | Status: AC
  Filled 2021-10-13: qty 20, 30d supply, fill #0

## 2021-10-14 ENCOUNTER — Other Ambulatory Visit (HOSPITAL_COMMUNITY): Payer: Self-pay

## 2021-11-15 NOTE — Progress Notes (Deleted)
  [facility] [address] [place] Phone: [phone] Subjective:    I'm seeing this patient by the request  of:  Vernie Shanks, MD  CC: low back pain follow up   OBS:[id number]  [name] is a 45 y.o. female coming in with complaint of back and neck pain Patient states   Medications patient has been prescribed:   Taking:         Reviewed prior external information including notes and imaging from previsou exam, outside providers and external EMR if available.   As well as notes that were available from care everywhere and other healthcare systems.  Past medical history, social, surgical and family history all reviewed in electronic medical record.  No pertanent information unless stated regarding to the chief complaint.   Past Medical History:  Diagnosis Date   ADD (attention deficit disorder)    Breast mass, right 04/2018   Seborrheic dermatitis    Vitamin D deficiency     No Known Allergies   Review of Systems:  No headache, visual changes, nausea, vomiting, diarrhea, constipation, dizziness, abdominal pain, skin rash, fevers, chills, night sweats, weight loss, swollen lymph nodes, body aches, joint swelling, chest pain, shortness of breath, mood changes. POSITIVE muscle aches  Objective  There were no vitals taken for this visit.   General: No apparent distress alert and oriented x3 mood and affect normal, dressed appropriately.  HEENT: Pupils equal, extraocular movements intact  Respiratory: Patient's speak in full sentences and does not appear short of breath  Cardiovascular: No lower extremity edema, non tender, no erythema  Neuro: Cranial nerves II through XII are intact, neurovascularly intact in all extremities with 2+ DTRs and 2+ pulses.  Gait normal with good balance and coordination.  MSK:  Non tender with full range of motion and good stability and symmetric strength and tone of shoulders, elbows,  wrist, hip, knee and ankles bilaterally.  Back - Normal skin, Spine with normal alignment and no deformity.  No tenderness to vertebral process palpation.  Paraspinous muscles are not tender and without spasm.   Range of motion is full at neck and lumbar sacral regions  Osteopathic findings  C2 flexed rotated and side bent right C6 flexed rotated and side bent left T3 extended rotated and side bent right inhaled rib T9 extended rotated and side bent left L2 flexed rotated and side bent right Sacrum right on right       Assessment and Plan: No problem-specific Assessment & Plan notes found for this encounter.    Nonallopathic problems  Decision today to treat with OMT was based on Physical Exam  After verbal consent patient was treated with HVLA, ME, FPR techniques in cervical, rib, thoracic, lumbar, and sacral  areas  Patient tolerated the procedure well with improvement in symptoms  Patient given exercises, stretches and lifestyle modifications  See medications in patient instructions if given  Patient will follow up in 4-8 weeks      The above documentation has been reviewed and is accurate and complete Lyndal Pulley, DO       Note: This dictation was prepared with Dragon dictation along with smaller phrase technology. Any transcriptional errors that result from this process are unintentional.

## 2021-11-16 ENCOUNTER — Ambulatory Visit: Payer: No Typology Code available for payment source | Admitting: Family Medicine

## 2021-11-16 DIAGNOSIS — Z1231 Encounter for screening mammogram for malignant neoplasm of breast: Secondary | ICD-10-CM | POA: Diagnosis not present

## 2021-12-06 ENCOUNTER — Emergency Department
Admission: EM | Admit: 2021-12-06 | Discharge: 2021-12-06 | Disposition: A | Discharge status: Home / Self Care | Payer: No Typology Code available for payment source | Source: Home / Self Care

## 2021-12-06 ENCOUNTER — Other Ambulatory Visit: Payer: Self-pay

## 2021-12-06 ENCOUNTER — Emergency Department (HOSPITAL_BASED_OUTPATIENT_CLINIC_OR_DEPARTMENT_OTHER): Payer: 59 | Admitting: Radiology

## 2021-12-06 ENCOUNTER — Emergency Department (HOSPITAL_BASED_OUTPATIENT_CLINIC_OR_DEPARTMENT_OTHER)
Admission: EM | Admit: 2021-12-06 | Discharge: 2021-12-07 | Disposition: A | Discharge status: Home / Self Care | Payer: 59 | Attending: Emergency Medicine | Admitting: Emergency Medicine

## 2021-12-06 ENCOUNTER — Other Ambulatory Visit (HOSPITAL_COMMUNITY): Payer: Self-pay

## 2021-12-06 ENCOUNTER — Encounter (HOSPITAL_BASED_OUTPATIENT_CLINIC_OR_DEPARTMENT_OTHER): Payer: Self-pay | Admitting: Obstetrics and Gynecology

## 2021-12-06 DIAGNOSIS — R072 Precordial pain: Secondary | ICD-10-CM | POA: Diagnosis not present

## 2021-12-06 DIAGNOSIS — R079 Chest pain, unspecified: Secondary | ICD-10-CM

## 2021-12-06 DIAGNOSIS — R42 Dizziness and giddiness: Secondary | ICD-10-CM | POA: Diagnosis not present

## 2021-12-06 DIAGNOSIS — I10 Essential (primary) hypertension: Secondary | ICD-10-CM | POA: Diagnosis not present

## 2021-12-06 LAB — CBC
HCT: 44.1 % (ref 36.0–46.0)
Hemoglobin: 14.3 g/dL (ref 12.0–15.0)
MCH: 29.4 pg (ref 26.0–34.0)
MCHC: 32.4 g/dL (ref 30.0–36.0)
MCV: 90.6 fL (ref 80.0–100.0)
Platelets: 278 10*3/uL (ref 150–400)
RBC: 4.87 MIL/uL (ref 3.87–5.11)
RDW: 12.8 % (ref 11.5–15.5)
WBC: 8.2 10*3/uL (ref 4.0–10.5)
nRBC: 0 % (ref 0.0–0.2)

## 2021-12-06 LAB — BASIC METABOLIC PANEL
Anion gap: 8 (ref 5–15)
BUN: 15 mg/dL (ref 6–20)
CO2: 28 mmol/L (ref 22–32)
Calcium: 10 mg/dL (ref 8.9–10.3)
Chloride: 104 mmol/L (ref 98–111)
Creatinine, Ser: 0.7 mg/dL (ref 0.44–1.00)
GFR, Estimated: >60 mL/min (ref >60)
Glucose, Bld: 84 mg/dL (ref 70–99)
Potassium: 3.9 mmol/L (ref 3.5–5.1)
Sodium: 140 mmol/L (ref 135–145)

## 2021-12-06 LAB — TROPONIN I (HIGH SENSITIVITY)
Troponin I (High Sensitivity): <2 ng/L (ref <18)
Troponin I (High Sensitivity): <2 ng/L (ref <18)

## 2021-12-06 LAB — PREGNANCY, URINE: Preg Test, Ur: NEGATIVE

## 2021-12-06 MED ORDER — PANTOPRAZOLE SODIUM 40 MG PO TBEC
40.0000 mg | DELAYED_RELEASE_TABLET | Freq: Every day | ORAL | 0 refills | Status: DC
  Filled 2021-12-06: qty 30, 30d supply, fill #0

## 2021-12-06 NOTE — Discharge Instructions (Signed)
GO TO ER °

## 2021-12-06 NOTE — ED Triage Notes (Addendum)
Pt presents with CP that began 1/1. Pt states she has a hx of CP but at this time her CP is accompanied with dizziness and left arm pain

## 2021-12-06 NOTE — ED Triage Notes (Signed)
Patient reports she has been having chest pain since yesterday and today it has worsened. Reports dizziness and left arm pain.

## 2021-12-06 NOTE — ED Provider Notes (Addendum)
[name]    CSN: [id number] Arrival date & time: 12/06/21  1711      History   Chief Complaint Chief Complaint  Patient presents with   Chest Pain    HPI [name] is a 46 y.o. female.   HPI Patient states that she is having chest pain.  Its in the left side of her chest heading up to the left shoulder.  She states that it is also causing her to feel dizzy.  Side causing her to feel afraid.  She states she was afraid to go to sleep last night because her chest was hurting.  She has had chest pain in the past but has not had any cardiac work-up.  The pain is nonexertional.  She does not have shortness of breath.  The pain came on for no reason, she does not have a history of heart disease, angina, hypertension or high cholesterol. Past Medical History:  Diagnosis Date   ADD (attention deficit disorder)    Breast mass, right 04/2018   Seborrheic dermatitis    Vitamin D deficiency     Patient Active Problem List   Diagnosis Date Noted   Low back pain 09/14/2021   Vitamin D insufficiency 10/14/2019   Class 1 obesity due to excess calories with body mass index (BMI) of 33.0 to 33.9 in adult 10/10/2019    Past Surgical History:  Procedure Laterality Date   BREAST LUMPECTOMY WITH RADIOACTIVE SEED LOCALIZATION Right 04/25/2018   Procedure: RIGHT BREAST LUMPECTOMY [place];  Surgeon: Erroll Luna, MD;  Location: [facility];  Service: General;  Laterality: Right;   CESAREAN SECTION  10/15/2006   CESAREAN SECTION  09/17/2008   CESAREAN SECTION     3 total   DILATION AND EVACUATION  12/10/2005   TONSILLECTOMY      OB History     Gravida  4   Para  3   Term      Preterm      AB      Living         SAB      IAB      Ectopic      Multiple      Live Births               Home Medications    Prior to Admission medications   Medication Sig Start Date End Date Taking? Authorizing  Provider  aspirin EC 81 MG tablet Take 81 mg by mouth daily. Swallow whole.   Yes [provider]  azithromycin (ZITHROMAX) 250 MG tablet Take 1 tablet (250 mg total) by mouth daily. 2 tab day 1, then 1 tab daily Patient not taking: Reported on 12/06/2021 07/03/20   Truett Mainland, DO  ipratropium (ATROVENT) 0.03 % nasal spray Use 2 sprays in each nostril Twice a day Patient not taking: Reported on 12/06/2021 04/08/21     phentermine (ADIPEX-P) 37.5 MG tablet TAKE 1 TABLET BY MOUTH ONCE A DAY Patient not taking: Reported on 12/06/2021 11/26/20 05/25/21  Hinton Rao, MD  tretinoin (RETIN-A) 0.025 % cream Apply a small amount to face every night Patient not taking: Reported on 12/06/2021 10/13/21     Vitamin D, Ergocalciferol, (DRISDOL) 1.25 MG (50000 UNIT) CAPS capsule Take 1 capsule (50,000 Units total) by mouth every 7 (seven) days. 01/28/20   Georgia Lopes, DO    Family History Family History  Problem Relation Age of Onset  Diabetes Mother    High blood pressure Mother    Cancer Mother    Depression Mother    Anxiety disorder Mother    Bipolar disorder Mother    Drug abuse Mother    Obesity Mother    Alcoholism Father    Drug abuse Father    Obesity Father     Social History Social History   Tobacco Use   Smoking status: Never   Smokeless tobacco: Never  Vaping Use   Vaping Use: Never used  Substance Use Topics   Alcohol use: Yes    Comment: occasionally   Drug use: Never     Allergies   Patient has no known allergies.   Review of Systems Review of Systems See HPI  Physical Exam Triage Vital Signs ED Triage Vitals  Enc Vitals Group     BP 12/06/21 1740 (!) 178/95     Pulse Rate 12/06/21 1740 81     Resp 12/06/21 1740 14     Temp 12/06/21 1740 99 F (37.2 C)     Temp Source 12/06/21 1740 Oral     SpO2 12/06/21 1740 98 %     Weight 12/06/21 1809 179 lb 8 oz (81.4 kg)     Height --      Head Circumference --      Peak Flow --      Pain Score  12/06/21 1741 3     Pain Loc --      Pain Edu? --      Excl. in [place]? --    No data found.  Updated Vital Signs BP (!) 178/95 (BP Location: Right Arm)    Pulse 81    Temp 99 F (37.2 C) (Oral)    Resp 14    Wt 81.4 kg    SpO2 98%    BMI 33.92 kg/m      Physical Exam Constitutional:      General: She is not in acute distress.    Appearance: She is well-developed. She is ill-appearing.     Comments: Anxious  HENT:     Head: Normocephalic and atraumatic.  Eyes:     Conjunctiva/sclera: Conjunctivae normal.     Pupils: Pupils are equal, round, and reactive to light.  Cardiovascular:     Rate and Rhythm: Normal rate and regular rhythm.     Heart sounds: Normal heart sounds.  Pulmonary:     Effort: Pulmonary effort is normal. No respiratory distress.  Abdominal:     General: There is no distension.     Palpations: Abdomen is soft.  Musculoskeletal:        General: Normal range of motion.     Cervical back: Normal range of motion.  Skin:    General: Skin is warm and dry.  Neurological:     Mental Status: She is alert.     UC Treatments / Results  Labs (all labs ordered are listed, but only abnormal results are displayed) Labs Reviewed - No data to display  EKG EKG is normal sinus rhythm.  Rate is 82.  No ST or T wave changes.  Radiology No results found.  Procedures Procedures (including critical care time)  Medications Ordered in UC Medications - No data to display  Initial Impression / Assessment and Plan / UC Course  I have reviewed the triage vital signs and the nursing notes.  Pertinent labs & imaging results that were available during my care of the patient were reviewed by  me and considered in my medical decision making (see chart for details).     EKG is normal.  Patient does not have a diagnosis of cardiac disease to support angina, however, she is having chest pain that radiates to her left shoulder and arm accompanied by dizziness.  She states that it  is causing her to feel panicky and is afraid to go to sleep because of it.  She feels like it might be her heart.  I explained to her that the urgent care center is not a place to do any kind of cardiac rule out evaluation, I recommend emergency room evaluation.  She states the pain started yesterday and is not any worse.  I agreed to let her go to the ER by private vehicle Final Clinical Impressions(s) / UC Diagnoses   Final diagnoses:  Chest pain, unspecified type     Discharge Instructions      GO TO ER   ED Prescriptions   None    PDMP not reviewed this encounter.   Raylene Everts, MD 12/06/21 1836    Raylene Everts, MD 12/06/21 [phone]

## 2021-12-06 NOTE — ED Notes (Signed)
Patient is being discharged from the Urgent Care and sent to the Emergency Department via POV driven by family. Per Dr Meda Coffee, patient is in need of higher level of care due to chest pain. Patient is aware and verbalizes understanding of plan of care.  Vitals:   12/06/21 1740  BP: (!) 178/95  Pulse: 81  Resp: 14  Temp: 99 F (37.2 C)  SpO2: 98%

## 2021-12-07 ENCOUNTER — Other Ambulatory Visit (HOSPITAL_COMMUNITY): Payer: Self-pay

## 2021-12-07 NOTE — Discharge Instructions (Addendum)

## 2021-12-07 NOTE — ED Provider Notes (Signed)
[facility] EMERGENCY DEPT Provider Note   CSN: [id number] Arrival date & time: 12/06/21  1924     History  Chief Complaint  Patient presents with   Chest Pain    [name] is a 46 y.o. female.  The history is provided by the patient.  Chest Pain Pain location:  L chest Pain quality: pressure   Pain radiates to:  L shoulder Pain severity:  Moderate Onset quality:  Gradual Duration:  2 days Timing:  Intermittent Progression:  Worsening Chronicity:  New Relieved by:  Nothing Worsened by:  Nothing Associated symptoms: dizziness   Associated symptoms: no abdominal pain, no cough, no fever, no shortness of breath, no syncope and no vomiting   Patient with history of GERD presents with chest pain.  She reports starting about 2 days ago she began having left-sided chest pain that goes into her left arm.  She reports dizziness.  No fever/vomiting/shortness of breath/diaphoresis No previous history of CAD/VTE HPI: A 46 year old patient with a history of hypertension and obesity presents for evaluation of chest pain. Initial onset of pain was approximately 1-3 hours ago. The patient's chest pain is described as heaviness/pressure/tightness and is not worse with exertion. The patient's chest pain is middle- or left-sided, is not well-localized, is not sharp and does radiate to the arms/jaw/neck. The patient does not complain of nausea and denies diaphoresis. The patient has no history of stroke, has no history of peripheral artery disease, has not smoked in the past 90 days, denies any history of treated diabetes, has no relevant family history of coronary artery disease (first degree relative at less than age 30) and has no history of hypercholesterolemia.   Home Medications Prior to Admission medications   Medication Sig Start Date End Date Taking? Authorizing Provider  pantoprazole (PROTONIX) 40 MG tablet Take 1 tablet (40 mg total) by mouth daily. 12/06/21  Yes Ripley Fraise, MD  aspirin EC 81 MG tablet Take 81 mg by mouth daily. Swallow whole.    [provider]  phentermine (ADIPEX-P) 37.5 MG tablet TAKE 1 TABLET BY MOUTH ONCE A DAY Patient not taking: Reported on 12/06/2021 11/26/20 05/25/21  Hinton Rao, MD  tretinoin (RETIN-A) 0.025 % cream Apply a small amount to face every night Patient not taking: Reported on 12/06/2021 10/13/21     Vitamin D, Ergocalciferol, (DRISDOL) 1.25 MG (50000 UNIT) CAPS capsule Take 1 capsule (50,000 Units total) by mouth every 7 (seven) days. 01/28/20   Georgia Lopes, DO      Allergies    Patient has no known allergies.    Review of Systems   Review of Systems  Constitutional:  Negative for fever.  Respiratory:  Negative for cough and shortness of breath.   Cardiovascular:  Positive for chest pain. Negative for syncope.  Gastrointestinal:  Negative for abdominal pain and vomiting.  Neurological:  Positive for dizziness.  All other systems reviewed and are negative.  Physical Exam Updated Vital Signs BP (!) 144/99    Pulse 79    Temp 98.7 F (37.1 C)    Resp 17    SpO2 99%  Physical Exam CONSTITUTIONAL: Well developed/well nourished HEAD: Normocephalic/atraumatic EYES: EOMI/PERRL ENMT: Mucous membranes moist NECK: supple no meningeal signs SPINE/BACK:entire spine nontender CV: S1/S2 noted, no murmurs/rubs/gallops noted LUNGS: Lungs are clear to auscultation bilaterally, no apparent distress ABDOMEN: soft, nontender, no rebound or guarding, bowel sounds noted throughout abdomen GU:no cva tenderness NEURO: Pt is awake/alert/appropriate, moves all extremitiesx4.  No  facial droop.   EXTREMITIES: pulses normal/equal, full ROM, no lower extremity edema or tenderness SKIN: warm, color normal PSYCH: Anxious  ED Results / Procedures / Treatments   Labs (all labs ordered are listed, but only abnormal results are displayed) Labs Reviewed  BASIC METABOLIC PANEL  CBC  PREGNANCY, URINE  TROPONIN I (HIGH  SENSITIVITY)  TROPONIN I (HIGH SENSITIVITY)    EKG ED ECG REPORT   Date: 12/06/2021 2342  Rate: 65  Rhythm: normal sinus rhythm  QRS Axis: normal  Intervals: normal  ST/T Wave abnormalities: normal  Conduction Disutrbances:none  Narrative Interpretation:   Old EKG Reviewed: unchanged  I have personally reviewed the EKG tracing and agree with the computerized printout as noted.  Radiology DG Chest 2 View  Result Date: 12/06/2021 CLINICAL DATA:  Worsening chest pain since yesterday. EXAM: CHEST - 2 VIEW COMPARISON:  None. FINDINGS: The cardiomediastinal contours are normal. The lungs are clear. Pulmonary vasculature is normal. No consolidation, pleural effusion, or pneumothorax. No acute osseous abnormalities are seen. IMPRESSION: Negative radiographs of the chest. Electronically Signed   By: Keith Rake M.D.   On: 12/06/2021 20:17    Procedures Procedures    Medications Ordered in ED Medications - No data to display  ED Course/ Medical Decision Making/ A&P   HEAR Score: 2                       Medical Decision Making  This patient presents to the ED for concern of chest pain, this involves an extensive number of treatment options, and is a complaint that carries with it a high risk of complications and morbidity.  The differential diagnosis includes acute coronary syndrome, PE, aortic dissection, pneumonia, pericarditis, myocarditis  Additional history obtained:  Records reviewed  urgent care record  Lab Tests: I Ordered, and personally interpreted labs.  The pertinent results include: No acute findings  Imaging Studies ordered: I ordered imaging studies including X-ray chest   I independently visualized and interpreted imaging which showed no acute findings I agree with the radiologist interpretation  Cardiac Monitoring: The patient was maintained on a cardiac monitor.  I personally viewed and interpreted the cardiac monitor which showed an underlying rhythm of:   sinus rhythm   Test Considered: Patient is low risk / negative by heart score, therefore do not feel that admission is indicated.   Complexity of problems addressed: Patients presentation is most consistent with  acute presentation with potential threat to life or bodily function  Disposition: After consideration of the diagnostic results and the patients response to treatment,  I feel that the patent would benefit from discharge in stable condition .      Patient low risk for ACS/PE/dissection.  No tachycardia or hypoxia to suggest PE.  No pleuritic pain.  Patient is very well-appearing.  Reports some of the pain may be due to the fact she has been lifting weights recently. Will discharge home.  She requests PPI prescription       Final Clinical Impression(s) / ED Diagnoses Final diagnoses:  Precordial pain    Rx / DC Orders ED Discharge Orders          Ordered    pantoprazole (PROTONIX) 40 MG tablet  Daily        12/06/21 2339              Ripley Fraise, MD 12/07/21 0018

## 2021-12-08 ENCOUNTER — Ambulatory Visit: Payer: No Typology Code available for payment source | Admitting: Family Medicine

## 2021-12-15 DIAGNOSIS — I10 Essential (primary) hypertension: Secondary | ICD-10-CM | POA: Diagnosis not present

## 2021-12-15 DIAGNOSIS — K219 Gastro-esophageal reflux disease without esophagitis: Secondary | ICD-10-CM | POA: Diagnosis not present

## 2021-12-15 DIAGNOSIS — Z01419 Encounter for gynecological examination (general) (routine) without abnormal findings: Secondary | ICD-10-CM | POA: Diagnosis not present

## 2021-12-15 DIAGNOSIS — R079 Chest pain, unspecified: Secondary | ICD-10-CM | POA: Diagnosis not present

## 2021-12-15 DIAGNOSIS — Z30431 Encounter for routine checking of intrauterine contraceptive device: Secondary | ICD-10-CM | POA: Diagnosis not present

## 2021-12-24 ENCOUNTER — Encounter: Payer: Self-pay | Admitting: Gastroenterology

## 2022-01-25 ENCOUNTER — Encounter: Payer: Self-pay | Admitting: Cardiology

## 2022-01-25 ENCOUNTER — Other Ambulatory Visit (HOSPITAL_COMMUNITY): Payer: Self-pay

## 2022-01-25 ENCOUNTER — Other Ambulatory Visit: Payer: Self-pay

## 2022-01-25 ENCOUNTER — Ambulatory Visit: Payer: 59 | Admitting: Cardiology

## 2022-01-25 VITALS — BP 136/90 | HR 67 | Ht 61.0 in | Wt 178.8 lb

## 2022-01-25 DIAGNOSIS — Z79899 Other long term (current) drug therapy: Secondary | ICD-10-CM | POA: Diagnosis not present

## 2022-01-25 DIAGNOSIS — R072 Precordial pain: Secondary | ICD-10-CM

## 2022-01-25 LAB — BASIC METABOLIC PANEL
BUN/Creatinine Ratio: 17 (ref 9–23)
BUN: 13 mg/dL (ref 6–24)
CO2: 26 mmol/L (ref 20–29)
Calcium: 9.3 mg/dL (ref 8.7–10.2)
Chloride: 104 mmol/L (ref 96–106)
Creatinine, Ser: 0.78 mg/dL (ref 0.57–1.00)
Glucose: 90 mg/dL (ref 70–99)
Potassium: 4.2 mmol/L (ref 3.5–5.2)
Sodium: 142 mmol/L (ref 134–144)
eGFR: 97 mL/min/{1.73_m2} (ref >59)

## 2022-01-25 LAB — MAGNESIUM: Magnesium: 2.2 mg/dL (ref 1.6–2.3)

## 2022-01-25 MED ORDER — METOPROLOL TARTRATE 100 MG PO TABS
100.0000 mg | ORAL_TABLET | ORAL | 0 refills | Status: DC
  Filled 2022-01-25 – 2022-02-14 (×2): qty 1, 1d supply, fill #0

## 2022-01-25 NOTE — Progress Notes (Signed)
Cardiology Office Note:    Date:  01/25/2022   ID:  [name], DOB 1976-11-27, MRN [id number]  PCP:  Vernie Shanks, MD  Cardiologist:  Berniece Salines, DO  Electrophysiologist:  None   Referring MD: Servando Salina, MD   " I have been experiencing chest pain"  History of Present Illness:    [name] is a 46 y.o. female with a hx of GERD, vitamin D deficiency, presents to be evaluated for chest discomfort.  The patient was referred by Dr. Servando Salina.  The patient tells me that she has been experiencing chest discomfort for many years now.  She notes that back in junior high school he had an episode where she had significant left-sided chest pressure at that time her grandmother had taken her to be evaluated and there was no significant findings.  To that she had intermittent episodes.  Most recently she has had 2 episodes that she shared with me that have been profound.  On December 04, 2021 she was working her abbreviated shift on New Year's Eve.  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  She then started to rush around to help the team get up to speed.  During that time she felt physically tired but she pushed on.  On her way home that day she was driving she felt left-sided painful sensation and fell that pain radiating up her shoulder down her arm.  At first she contemplated going to the emergency department but she continued to drive home.  She got home she was very nervous that this was going to get worse.  After taking a shower he felt a little better but not fully resolved.  She was able to visit friends that evening but she did take it easy.  Then on December 06, 2021 she had another episode of chest discomfort.  She tells me that [place] went to [place] and then later she was driving home the same left-sided chest pain started occurred.  Felt pressure and shooting sensation on the left side again went up her  shoulder and down her arms.  At that time she felt a bit dizzy.  She then decided to go to the urgent care to an urgent care visit she was asked to go to the emergency department.  She went to be evaluated at the med center for [facility] and all of her testing were within normal limits.  She still does have these intermittent left-sided chest discomfort.  No significant shortness of breath, no palpitations.  Past Medical History:  Diagnosis Date   ADD (attention deficit disorder)    Breast mass, right 04/2018   GERD (gastroesophageal reflux disease)    Seborrheic dermatitis    Vitamin D deficiency     Past Surgical History:  Procedure Laterality Date   BREAST LUMPECTOMY WITH RADIOACTIVE SEED LOCALIZATION Right 04/25/2018   Procedure: RIGHT BREAST LUMPECTOMY WITH RADIOACTIVE SEED LOCALIZATION ERAS PATHWAY;  Surgeon: Erroll Luna, MD;  Location: [facility];  Service: General;  Laterality: Right;   CESAREAN SECTION  10/15/2006   CESAREAN SECTION  09/17/2008   CESAREAN SECTION     3 total   DILATION AND EVACUATION  12/10/2005   TONSILLECTOMY      Current Medications: Current Meds  Medication Sig   aspirin EC 81 MG tablet Take 81 mg by mouth daily. Swallow whole.   metoprolol tartrate (LOPRESSOR)  100 MG tablet Take 1 tablet (100 mg total) by mouth 2 hours prior to CT   pantoprazole (PROTONIX) 40 MG tablet Take 1 tablet (40 mg total) by mouth daily.   tretinoin (RETIN-A) 0.025 % cream Apply a small amount to face every night   Vitamin D, Ergocalciferol, (DRISDOL) 1.25 MG (50000 UNIT) CAPS capsule Take 1 capsule (50,000 Units total) by mouth every 7 (seven) days.     Allergies:   Patient has no known allergies.   Social History   Socioeconomic History   Marital status: Married    Spouse name: Jamal   Number of children: Not on file   Years of education: Not on file   Highest education level: Not on file  Occupational History   Occupation: RN labor & delivery   Tobacco Use   Smoking status: Never   Smokeless tobacco: Never  Vaping Use   Vaping Use: Never used  Substance and Sexual Activity   Alcohol use: Yes    Comment: occasionally   Drug use: Never   Sexual activity: Not on file  Other Topics Concern   Not on file  Social History Narrative   Not on file   Social Determinants of Health   Financial Resource Strain: Not on file  Food Insecurity: Not on file  Transportation Needs: Not on file  Physical Activity: Not on file  Stress: Not on file  Social Connections: Not on file     Family History: The patient's family history includes Alcoholism in her father; Anxiety disorder in her mother; Bipolar disorder in her mother; Cancer in her mother; Depression in her mother; Diabetes in her mother; Drug abuse in her father and mother; High blood pressure in her mother; Obesity in her father and mother.  ROS:   Review of Systems  Constitution: Negative for decreased appetite, fever and weight gain.  HENT: Negative for congestion, ear discharge, hoarse voice and sore throat.   Eyes: Negative for discharge, redness, vision loss in right eye and visual halos.  Cardiovascular: reports chest pain. Negative dyspnea on exertion, leg swelling, orthopnea and palpitations.  Respiratory: Negative for cough, hemoptysis, shortness of breath and snoring.   Endocrine: Negative for heat intolerance and polyphagia.  Hematologic/Lymphatic: Negative for bleeding problem. Does not bruise/bleed easily.  Skin: Negative for flushing, nail changes, rash and suspicious lesions.  Musculoskeletal: Negative for arthritis, joint pain, muscle cramps, myalgias, neck pain and stiffness.  Gastrointestinal: Negative for abdominal pain, bowel incontinence, diarrhea and excessive appetite.  Genitourinary: Negative for decreased libido, genital sores and incomplete emptying.  Neurological: Negative for brief paralysis, focal weakness, headaches and loss of balance.   Psychiatric/Behavioral: Negative for altered mental status, depression and suicidal ideas.  Allergic/Immunologic: Negative for HIV exposure and persistent infections.    EKGs/Labs/Other Studies Reviewed:    The following studies were reviewed today:   EKG: None today, reviewed previous EKG no ST segment changes.  Recent Labs: 12/06/2021: BUN 15; Creatinine, Ser 0.70; Hemoglobin 14.3; Platelets 278; Potassium 3.9; Sodium 140  Recent Lipid Panel No results found for: CHOL, TRIG, HDL, CHOLHDL, VLDL, LDLCALC, LDLDIRECT  Physical Exam:    VS:  BP 136/90    Pulse 67    Ht 5\' 1"  (1.549 m)    Wt 178 lb 12.8 oz (81.1 kg)    SpO2 98%    BMI 33.78 kg/m     Wt Readings from Last 3 Encounters:  01/25/22 178 lb 12.8 oz (81.1 kg)  12/06/21 179 lb 8 oz (  81.4 kg)  09/13/21 174 lb (78.9 kg)     GEN: Well nourished, well developed in no acute distress HEENT: Normal NECK: No JVD; No carotid bruits LYMPHATICS: No lymphadenopathy CARDIAC: S1S2 noted,RRR, no murmurs, rubs, gallops RESPIRATORY:  Clear to auscultation without rales, wheezing or rhonchi  ABDOMEN: Soft, non-tender, non-distended, +bowel sounds, no guarding. EXTREMITIES: No edema, No cyanosis, no clubbing MUSCULOSKELETAL:  No deformity  SKIN: Warm and dry NEUROLOGIC:  Alert and oriented x 3, non-focal PSYCHIATRIC:  Normal affect, good insight  ASSESSMENT:    1. Precordial pain   2. Medication management   3. Morbid obesity ([place])    PLAN:    She has had intermittent symptoms of chest pain, at this time I would like to get a coronary CT scan in this patient for 2 reasons, not only ruling out coronary artery disease.  Also understand the anatomical landmarks of her coronary arteries. I have educated the patient about this testing.  She has no IV contrast allergy.  She is agreeable to proceed with this.  The patient understands the need to lose weight with diet and exercise. We have discussed specific strategies for this.  The  patient is in agreement with the above plan. The patient left the office in stable condition.  The patient will follow up as needed.   Medication Adjustments/Labs and Tests Ordered: Current medicines are reviewed at length with the patient today.  Concerns regarding medicines are outlined above.  Orders Placed This Encounter  Procedures   CT CORONARY MORPH W/CTA COR W/SCORE W/CA W/CM &/OR WO/CM   Basic Metabolic Panel (BMET)   Magnesium   EKG 12-Lead   Meds ordered this encounter  Medications   metoprolol tartrate (LOPRESSOR) 100 MG tablet    Sig: Take 1 tablet (100 mg total) by mouth 2 hours prior to CT    Dispense:  1 tablet    Refill:  0    Patient Instructions  Medication Instructions:  Your physician recommends that you continue on your current medications as directed. Please refer to the Current Medication list given to you today.  *If you need a refill on your cardiac medications before your next appointment, please call your pharmacy*   Lab Work: Your physician recommends that you return for lab work in:  TODAY: BMET, [place] If you have labs (blood work) drawn today and your tests are completely normal, you will receive your results only by: MyChart Message (if you have [employer]) OR A paper copy in the mail If you have any lab test that is abnormal or we need to change your treatment, we will call you to review the results.   Testing/Procedures:   Your cardiac CT will be scheduled at one of the below locations:   [facility] [address] [place], [place] [address] [phone]   If scheduled at [facility], please arrive at the [facility] main entrance (entrance A) of [facility] 30 minutes prior to test start time. You can use the FREE valet parking offered at the main entrance (encouraged to control the heart rate for the test) Proceed to the [facility] Radiology Department (first floor) to check-in and test prep.   Please  follow these instructions carefully (unless otherwise directed):  On the Night Before the Test: Be sure to Drink plenty of water. Do not consume any caffeinated/decaffeinated beverages or chocolate 12 hours prior to your test. Do not take any antihistamines 12 hours prior to your test.  On the Day of the Test: Drink plenty of water until 1 hour prior to the test. Do not eat any food 4 hours prior to the test. You may take your regular medications prior to the test.  Take metoprolol (Lopressor) two hours prior to test. FEMALES- please wear underwire-free bra if available, avoid dresses & tight clothing       After the Test: Drink plenty of water. After receiving IV contrast, you may experience a mild flushed feeling. This is normal. On occasion, you may experience a mild rash up to 24 hours after the test. This is not dangerous. If this occurs, you can take Benadryl 25 mg and increase your fluid intake. If you experience trouble breathing, this can be serious. If it is severe call 911 IMMEDIATELY. If it is mild, please call our office. If you take any of these medications: Glipizide/Metformin, Avandament, Glucavance, please do not take 48 hours after completing test unless otherwise instructed.  We will call to schedule your test 2-4 weeks out understanding that some insurance companies will need an authorization prior to the service being performed.   For non-scheduling related questions, please contact the cardiac imaging nurse navigator should you have any questions/concerns: Marchia Bond, Cardiac Imaging Nurse Navigator Gordy Clement, Cardiac Imaging Nurse Navigator [facility] Direct Office Dial: [phone]   For scheduling needs, including cancellations and rescheduling, please call [place], [phone].    Follow-Up: At [facility], you and your health needs are our priority.  As part of our continuing mission to provide you with exceptional  heart care, we have created designated Provider Care Teams.  These Care Teams include your primary Cardiologist (physician) and Advanced Practice Providers (APPs -  Physician Assistants and Nurse Practitioners) who all work together to provide you with the care you need, when you need it.  We recommend signing up for the patient portal called "MyChart".  Sign up information is provided on this After Visit Summary.  MyChart is used to connect with patients for Virtual Visits (Telemedicine).  Patients are able to view lab/test results, encounter notes, upcoming appointments, etc.  Non-urgent messages can be sent to your provider as well.   To learn more about what you can do with MyChart, go to [email or web address].    Your next appointment:   As needed  The format for your next appointment:   In Person  Provider:   Berniece Salines, DO     Other Instructions  KardiaMobile [email or web address]        FDA-cleared, clinical grade mobile EKG monitor: Jodelle Red is the most clinically-validated mobile EKG used by the world's leading cardiac care medical professionals With Basic service, know instantly if your heart rhythm is normal or if atrial fibrillation is detected, and email the last single EKG recording to yourself or your doctor Premium service, available for purchase through the Kardia app for $9.99 per month or $99 per year, includes unlimited history and storage of your EKG recordings, a monthly EKG summary report to share with your doctor, along with the ability to track your blood pressure, activity and weight Includes one KardiaMobile phone clip FREE SHIPPING: Standard delivery 1-3 business days. Orders placed by 11:00am PST will ship that afternoon. Otherwise, will ship next business day. All orders ship via [employer] from [place], [place] - sending an EKG [employer] and set up profile. Run EKG - by placing 1-2 fingers on the silver  plates After EKG  is complete - Download PDF  - Skip password (if you apply a password the provider will need it to view the EKG) Click share button (square with upward arrow) in bottom left corner To send: choose MyChart (first time log into MyChart)  Pop up window about sending ECG Click continue Choose type of message Choose provider Type subject and message Click send (EKG should be attached)  - To send additional EKGs in one message click the paperclip image and bottom of page to attach.      Adopting a Healthy Lifestyle.  Know what a healthy weight is for you (roughly BMI <25) and aim to maintain this   Aim for 7+ servings of fruits and vegetables daily   65-80+ fluid ounces of water or unsweet tea for healthy kidneys   Limit to max 1 drink of alcohol per day; avoid smoking/tobacco   Limit animal fats in diet for cholesterol and heart health - choose grass fed whenever available   Avoid highly processed foods, and foods high in saturated/trans fats   Aim for low stress - take time to unwind and care for your mental health   Aim for 150 min of moderate intensity exercise weekly for heart health, and weights twice weekly for bone health   Aim for 7-9 hours of sleep daily   When it comes to diets, agreement about the perfect plan isnt easy to find, even among the experts. Experts at the [employer] developed an idea known as the Healthy Eating Plate. Just imagine a plate divided into logical, healthy portions.   The emphasis is on diet quality:   Load up on vegetables and fruits - one-half of your plate: Aim for color and variety, and remember that potatoes dont count.   Go for whole grains - one-quarter of your plate: Whole wheat, barley, wheat berries, quinoa, oats, brown rice, and foods made with them. If you want pasta, go with whole wheat pasta.   Protein power - one-quarter of your plate: Fish, chicken, beans, and nuts are all healthy,  versatile protein sources. Limit red meat.   The diet, however, does go beyond the plate, offering a few other suggestions.   Use healthy plant oils, such as olive, canola, soy, corn, sunflower and peanut. Check the labels, and avoid partially hydrogenated oil, which have unhealthy trans fats.   If youre thirsty, drink water. Coffee and tea are good in moderation, but skip sugary drinks and limit milk and dairy products to one or two daily servings.   The type of carbohydrate in the diet is more important than the amount. Some sources of carbohydrates, such as vegetables, fruits, whole grains, and beans-are healthier than others.   Finally, stay active  Signed, Berniece Salines, DO  01/25/2022 1:32 PM    [facility]

## 2022-01-25 NOTE — Patient Instructions (Addendum)
Medication Instructions:  Your physician recommends that you continue on your current medications as directed. Please refer to the Current Medication list given to you today.  *If you need a refill on your cardiac medications before your next appointment, please call your pharmacy*   Lab Work: Your physician recommends that you return for lab work in:  TODAY: BMET, [place] If you have labs (blood work) drawn today and your tests are completely normal, you will receive your results only by: MyChart Message (if you have [place]) OR A paper copy in the mail If you have any lab test that is abnormal or we need to change your treatment, we will call you to review the results.   Testing/Procedures:   Your cardiac CT will be scheduled at one of the below locations:   [facility] [address] [place], [place] [address] [phone]   If scheduled at [facility], please arrive at the [facility] main entrance (entrance A) of [facility] 30 minutes prior to test start time. You can use the FREE valet parking offered at the main entrance (encouraged to control the heart rate for the test) Proceed to the [facility] Radiology Department (first floor) to check-in and test prep.   Please follow these instructions carefully (unless otherwise directed):  On the Night Before the Test: Be sure to Drink plenty of water. Do not consume any caffeinated/decaffeinated beverages or chocolate 12 hours prior to your test. Do not take any antihistamines 12 hours prior to your test.  On the Day of the Test: Drink plenty of water until 1 hour prior to the test. Do not eat any food 4 hours prior to the test. You may take your regular medications prior to the test.  Take metoprolol (Lopressor) two hours prior to test. FEMALES- please wear underwire-free bra if available, avoid dresses & tight clothing       After the Test: Drink plenty of water. After receiving IV contrast,  you may experience a mild flushed feeling. This is normal. On occasion, you may experience a mild rash up to 24 hours after the test. This is not dangerous. If this occurs, you can take Benadryl 25 mg and increase your fluid intake. If you experience trouble breathing, this can be serious. If it is severe call 911 IMMEDIATELY. If it is mild, please call our office. If you take any of these medications: Glipizide/Metformin, Avandament, Glucavance, please do not take 48 hours after completing test unless otherwise instructed.  We will call to schedule your test 2-4 weeks out understanding that some insurance companies will need an authorization prior to the service being performed.   For non-scheduling related questions, please contact the cardiac imaging nurse navigator should you have any questions/concerns: Marchia Bond, Cardiac Imaging Nurse Navigator Gordy Clement, Cardiac Imaging Nurse Navigator [facility] Direct Office Dial: [phone]   For scheduling needs, including cancellations and rescheduling, please call [place], [phone].    Follow-Up: At [facility], you and your health needs are our priority.  As part of our continuing mission to provide you with exceptional heart care, we have created designated Provider Care Teams.  These Care Teams include your primary Cardiologist (physician) and Advanced Practice Providers (APPs -  Physician Assistants and Nurse Practitioners) who all work together to provide you with the care you need, when you need it.  We recommend signing up for the patient portal called "MyChart".  Sign up information is provided  on this After Visit Summary.  MyChart is used to connect with patients for Virtual Visits (Telemedicine).  Patients are able to view lab/test results, encounter notes, upcoming appointments, etc.  Non-urgent messages can be sent to your provider as well.   To learn more about what you can do with MyChart,  go to [email or web address].    Your next appointment:   As needed  The format for your next appointment:   In Person  Provider:   Berniece Salines, DO     Other Instructions  KardiaMobile [email or web address]        FDA-cleared, clinical grade mobile EKG monitor: Jodelle Red is the most clinically-validated mobile EKG used by the world's leading cardiac care medical professionals With Basic service, know instantly if your heart rhythm is normal or if atrial fibrillation is detected, and email the last single EKG recording to yourself or your doctor Premium service, available for purchase through the K[employer]ia app for $9.99 per month or $99 per year, includes unlimited history and storage of your EKG recordings, a monthly EKG summary report to share with your doctor, along with the ability to track your blood pressure, activity and weight Includes one K[employer]iaMobile phone clip FREE SHIPPING: Standard delivery 1-3 business days. Orders placed by 11:00am PST will ship that afternoon. Otherwise, will ship next business day. All orders ship via [employer] from [place], [place] - sending an EKG [employer] and set up profile. Run EKG - by placing 1-2 fingers on the silver plates After EKG is complete - Download PDF  - Skip password (if you apply a password the provider will need it to view the EKG) Click share button (square with upward arrow) in bottom left corner To send: choose MyChart (first time log into MyChart)  Pop up window about sending ECG Click continue Choose type of message Choose provider Type subject and message Click send (EKG should be attached)  - To send additional EKGs in one message click the paperclip image and bottom of page to attach.

## 2022-02-02 ENCOUNTER — Ambulatory Visit: Payer: 59 | Admitting: Gastroenterology

## 2022-02-02 ENCOUNTER — Other Ambulatory Visit (HOSPITAL_COMMUNITY): Payer: Self-pay

## 2022-02-02 ENCOUNTER — Encounter: Payer: Self-pay | Admitting: Gastroenterology

## 2022-02-02 VITALS — BP 124/78 | HR 84 | Ht 61.0 in | Wt 176.0 lb

## 2022-02-02 DIAGNOSIS — R0989 Other specified symptoms and signs involving the circulatory and respiratory systems: Secondary | ICD-10-CM | POA: Diagnosis not present

## 2022-02-02 DIAGNOSIS — R079 Chest pain, unspecified: Secondary | ICD-10-CM

## 2022-02-02 MED ORDER — PANTOPRAZOLE SODIUM 40 MG PO TBEC
40.0000 mg | DELAYED_RELEASE_TABLET | Freq: Every day | ORAL | 1 refills | Status: DC
  Filled 2022-02-02 – 2022-06-03 (×2): qty 30, 30d supply, fill #0
  Filled 2022-06-03: qty 30, 30d supply, fill #1

## 2022-02-02 NOTE — Progress Notes (Signed)
? ?HPI :  ?46 year old female with a history of chest pain, hypertension, question of GERD, referred by Dr. Yaakov Guthrie for further evaluation of possible reflux/chest pain. ? ?Patient states from her high school years through adulthood she has been experiencing sporadic pains in her left chest.  Usually this occurs once every 6 months to 12 months, will feel a fleeting pain in her left chest that lasts seconds and then resolves on its own. ? 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  This past New Year's Eve she states she was very busy and was pushing a heavy cart at work, developed pain in her left chest that went down to her left arm.  This symptom was very different than what she had had in the past.  She felt it persist over time and lasted much longer than usual, eventually went to the emergency room.  Had an EKG, chest x-ray, lab work including troponins negative x2 and told it was not a heart problem.  She states it went away and then she went back to work and was on light duty for a while.  That expired and she started pushing the cart at work again and felt recurrence of pain in her left chest, this time she recognized it and thought it might be due to musculoskeletal injury from pushing the cart. ? ?She was recently seen by cardiology, Dr. Harriet Masson, has been referred for a cardiac CT and this is currently pending to ensure no cardiac component to her symptoms. ? ?Over the last 2 years she was given Protonix to see if that would help prevent her chest pain episodes however she states she really did not take it much at all. ? ?At baseline the other thing she complains of is frequent throat clearing.  She states this is been going on for the past 3 years or so.  She denies any pyrosis, has occasional regurgitation that bothers her but not too often.  Denies any voice changes at baseline or hoarseness of voice.  Denies any dysphagia at baseline.  No nausea or vomiting.  She eats well.  No postprandial  abdominal pains.  No diarrhea or constipation.  No blood in her stools.  She denies any problems with postnasal drip, has occasional allergies but not frequently.  Her voice frequently needs to be cleared and she states that others around her have asked her about this as well. ? ?She denies any family history of colon cancer, gastric cancer, esophageal cancer. ? ? ? ?Past Medical History:  ?Diagnosis Date  ? ADD (attention deficit disorder)   ? Breast mass, right 04/2018  ? GERD (gastroesophageal reflux disease)   ? Hypertension   ? Seborrheic dermatitis   ? Vitamin D deficiency   ? ? ? ?Past Surgical History:  ?Procedure Laterality Date  ? BREAST LUMPECTOMY WITH RADIOACTIVE SEED LOCALIZATION Right 04/25/2018  ? Procedure: RIGHT BREAST LUMPECTOMY WITH RADIOACTIVE SEED LOCALIZATION ERAS PATHWAY;  Surgeon: Erroll Luna, MD;  Location: [place];  Service: General;  Laterality: Right;  ? CESAREAN SECTION  10/15/2006  ? CESAREAN SECTION  09/17/2008  ? CESAREAN SECTION    ? 3 total  ? DILATION AND EVACUATION  12/10/2005  ? TONSILLECTOMY    ? ?Family History  ?Problem Relation Age of Onset  ? Diabetes Mother   ? High blood pressure Mother   ? Cancer Mother   ? Depression Mother   ? Anxiety disorder Mother   ? Bipolar disorder  Mother   ? Drug abuse Mother   ? Obesity Mother   ? Alcoholism Father   ? Drug abuse Father   ? Obesity Father   ? ?Social History  ? ?Tobacco Use  ? Smoking status: Never  ? Smokeless tobacco: Never  ?Vaping Use  ? Vaping Use: Never used  ?Substance Use Topics  ? Alcohol use: Yes  ?  Comment: occasionally  ? Drug use: Never  ? ?Current Outpatient Medications  ?Medication Sig Dispense Refill  ? metoprolol tartrate (LOPRESSOR) 100 MG tablet Take 1 tablet (100 mg total) by mouth 2 hours prior to CT 1 tablet 0  ? pantoprazole (PROTONIX) 40 MG tablet Take 1 tablet (40 mg total) by mouth daily. (Patient taking differently: Take 40 mg by mouth as needed.) 30 tablet 0  ? tretinoin  (RETIN-A) 0.025 % cream Apply a small amount to face every night 20 g 3  ? Vitamin D, Ergocalciferol, (DRISDOL) 1.25 MG (50000 UNIT) CAPS capsule Take 1 capsule (50,000 Units total) by mouth every 7 (seven) days. 4 capsule 0  ? ?No current facility-administered medications for this visit.  ? ?No Known Allergies ? ? ?Review of Systems: ?All systems reviewed and negative except where noted in HPI.  ? ?Lab Results  ?Component Value Date  ? WBC 8.2 12/06/2021  ? HGB 14.3 12/06/2021  ? HCT 44.1 12/06/2021  ? MCV 90.6 12/06/2021  ? PLT 278 12/06/2021  ? ? ?Lab Results  ?Component Value Date  ? CREATININE 0.78 01/25/2022  ? BUN 13 01/25/2022  ? NA 142 01/25/2022  ? K 4.2 01/25/2022  ? CL 104 01/25/2022  ? CO2 26 01/25/2022  ? ? ?Lab Results  ?Component Value Date  ? ALT 19 10/10/2019  ? AST 18 10/10/2019  ? ALKPHOS 62 10/10/2019  ? BILITOT 0.5 10/10/2019  ? ? ? ?Physical Exam: ?BP 124/78   Pulse 84   Ht 5\' 1"  (1.549 m)   Wt 176 lb (79.8 kg)   BMI 33.25 kg/m?  ?Constitutional: Pleasant,well-developed, female in no acute distress. ?HEENT: Normocephalic and atraumatic. Conjunctivae are normal. No scleral icterus. ?Neck supple.  ?Cardiovascular: Normal rate, regular rhythm.  ?Pulmonary/chest: Effort normal and breath sounds normal.  ?Abdominal: Soft, nondistended, nontender.  There are no masses palpable.  ?Extremities: no edema ?Lymphadenopathy: No cervical adenopathy noted. ?Neurological: Alert and oriented to person place and time. ?Skin: Skin is warm and dry. No rashes noted. ?Psychiatric: Normal mood and affect. Behavior is normal. ? ? ?ASSESSMENT AND PLAN: ?46 year old female here for new patient assessment of the following: ? ?Chest pain ?Throat clearing ? ?History as above.  Chest pain episodes in general have been fleeting and rare over the past several years.  New chest pain symptoms that led to ED visit was much different, negative cardiac evaluation in the ED.  She has seen cardiology and getting a cardiac CT  done to ensure this looks okay which is reasonable.  Otherwise her chest pain seems to be more so musculoskeletal based on her observation of when it bothers her at work etc, when pushing a heavy cart. ? ?While the pain in her chest may be less likely related to reflux, it is possible that reflux could be causing her throat clearing symptoms.  She does have occasional regurgitation but otherwise no typical GERD symptoms.  She was given a trial of Protonix but really has not taken it much with any regularity.  We discussed differential diagnosis for this issue, it does bother her  on a routine basis and wants to treat it/evaluate for it.  Discussed options with her.  I would like to try her on Protonix 40 mg once daily for at least 6 weeks and see if this makes any noticeable difference to her throat clearing.  If this resolves her problem then it would be very likely related to reflux.  If it persists despite PPI then we may need to consider referral to ENT etc.  To most definitively rule out reflux would require EGD and pH test off PPI, she wants to avoid invasive procedures possible for now but is agreeable to trial of Protonix.  She will contact me if symptoms persist over the next few months. ? ?Plan: ?- trial of protonix 40mg  / day for 6 weeks for treatment of throat clearing. If symptoms persist despite this she should contact me and will consider further workup as needed ?- agree with cardiac CT per cardiology, although chest pain based on her description of symptoms after recent observation makes this perhaps more likely musculoskeletal ?- holding off on endoscopic evaluation at this time. If throat clearing persists despite PPI and ENT evaluation negative, consideration for EGD with [facility] off PPI ? ?Jolly Mango, MD ?[facility] ? ?CC: ?Vernie Shanks, MD ? ?

## 2022-02-02 NOTE — Patient Instructions (Addendum)
If you are age 44 or older, your body mass index should be between 23-30. Your Body mass index is 33.25 kg/m?Marland Kitchen If this is out of the aforementioned range listed, please consider follow up with your Primary Care Provider. ? ?If you are age 39 or younger, your body mass index should be between 19-25. Your Body mass index is 33.25 kg/m?Marland Kitchen If this is out of the aformentioned range listed, please consider follow up with your Primary Care Provider.  ? ?________________________________________________________ ? ?The [employer] [facility] providers would like to encourage you to use [facility] to communicate with providers for non-urgent requests or questions.  Due to long hold times on the telephone, sending your provider a message by [facility] may be a faster and more efficient way to get a response.  Please allow 48 business hours for a response.  Please remember that this is for non-urgent requests.  ?_______________________________________________________ ? ?Complete your cardiac workup (Cardiac CT). ? ? ?Take Protonix 40 mg once daily for 6 weeks.  Please give [place] an update after this time so that we can advise on next steps pending response. ? ?Thank you for entrusting me with your care and for choosing [employer], ?Dr. Blossom Cellar ? ? ? ?

## 2022-02-14 ENCOUNTER — Other Ambulatory Visit: Payer: Self-pay

## 2022-02-15 ENCOUNTER — Telehealth (HOSPITAL_COMMUNITY): Payer: Self-pay | Admitting: Emergency Medicine

## 2022-02-15 NOTE — Telephone Encounter (Signed)
Attempted to call patient regarding upcoming cardiac CT appointment. °Left message on voicemail with name and callback number °Creedence Heiss RN Navigator Cardiac Imaging °[facility] °[phone] Office °[phone] Cell ° °

## 2022-02-17 ENCOUNTER — Other Ambulatory Visit: Payer: Self-pay

## 2022-02-17 ENCOUNTER — Ambulatory Visit (HOSPITAL_COMMUNITY)
Admission: RE | Admit: 2022-02-17 | Discharge: 2022-02-17 | Disposition: A | Discharge status: Home / Self Care | Payer: 59 | Source: Ambulatory Visit | Attending: Cardiology | Admitting: Cardiology

## 2022-02-17 DIAGNOSIS — R072 Precordial pain: Secondary | ICD-10-CM | POA: Diagnosis not present

## 2022-02-17 MED ORDER — IOHEXOL 350 MG/ML SOLN
100.0000 mL | Freq: Once | INTRAVENOUS | Status: AC | PRN
  Administered 2022-02-17: 95 mL via INTRAVENOUS

## 2022-02-17 MED ORDER — NITROGLYCERIN 0.4 MG SL SUBL
SUBLINGUAL_TABLET | SUBLINGUAL | Status: AC
  Filled 2022-02-17: qty 2

## 2022-02-17 MED ORDER — NITROGLYCERIN 0.4 MG SL SUBL
0.8000 mg | SUBLINGUAL_TABLET | Freq: Once | SUBLINGUAL | Status: AC
  Administered 2022-02-17: 0.8 mg via SUBLINGUAL

## 2022-02-28 ENCOUNTER — Telehealth: Payer: Self-pay | Admitting: Gastroenterology

## 2022-02-28 NOTE — Telephone Encounter (Signed)
Dr. Garwin Brothers, pt's OB/GYN, called to request the office note from 02/02/22 be sent to her so she can close out the referral.  Fax [phone].  Thank you. ?

## 2022-02-28 NOTE — Telephone Encounter (Signed)
Office note sent to Dr. Maudry Diego at the fax number provided ?

## 2022-03-29 ENCOUNTER — Other Ambulatory Visit (HOSPITAL_COMMUNITY): Payer: Self-pay

## 2022-04-15 DIAGNOSIS — Z6834 Body mass index (BMI) 34.0-34.9, adult: Secondary | ICD-10-CM | POA: Diagnosis not present

## 2022-04-15 DIAGNOSIS — S29011A Strain of muscle and tendon of front wall of thorax, initial encounter: Secondary | ICD-10-CM | POA: Diagnosis not present

## 2022-04-15 DIAGNOSIS — Z13220 Encounter for screening for lipoid disorders: Secondary | ICD-10-CM | POA: Diagnosis not present

## 2022-04-15 DIAGNOSIS — Z0000 Encounter for general adult medical examination without abnormal findings: Secondary | ICD-10-CM | POA: Diagnosis not present

## 2022-04-15 DIAGNOSIS — E6609 Other obesity due to excess calories: Secondary | ICD-10-CM | POA: Diagnosis not present

## 2022-04-15 DIAGNOSIS — D500 Iron deficiency anemia secondary to blood loss (chronic): Secondary | ICD-10-CM | POA: Diagnosis not present

## 2022-04-15 DIAGNOSIS — E559 Vitamin D deficiency, unspecified: Secondary | ICD-10-CM | POA: Diagnosis not present

## 2022-05-20 IMAGING — CT CT HEART MORP W/ CTA COR W/ SCORE W/ CA W/CM &/OR W/O CM
4 of 7 series · 8 of 20 positions shown, 9 images · IV contrast (APPLIED)
Comparison: None.
COMPARISON: None.

Addendum:
EXAM:
OVER-READ INTERPRETATION  CT CHEST

The following report is an over-read performed by radiologist Dr.
Yong Hoon Redfear [REDACTED] on 02/17/2022. This
over-read does not include interpretation of cardiac or coronary
anatomy or pathology. The coronary CTA interpretation by the
cardiologist is attached.
CLINICAL DATA: This is a 45 year old female with anginal symptoms.
Cardiac/Coronary  CTA
TECHNIQUE: The patient was scanned on a [employer] Force scanner.

[Series 6: ts diast sharp · axial · 0.39mm/px · z∈[-58,-26]mm · 2 of 239 slices shown]
[im 80/239  lung]
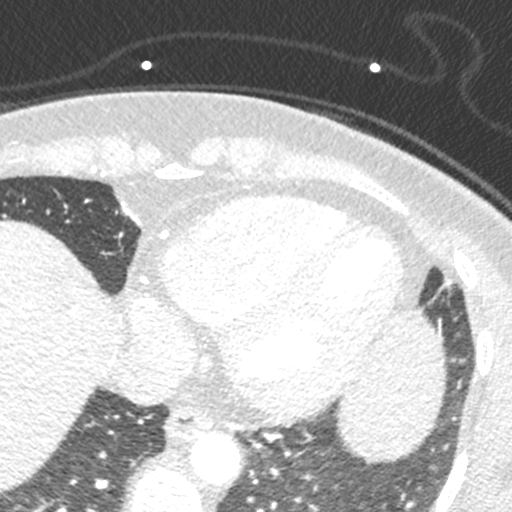
[im 159/239  lung]
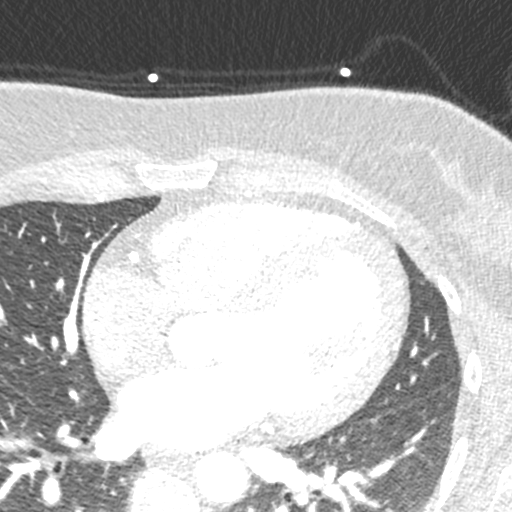

[Series 7: ts syst sharp · axial · 0.39mm/px · z∈[-58,-26]mm · 2 of 239 slices shown]
[im 80/239  lung]
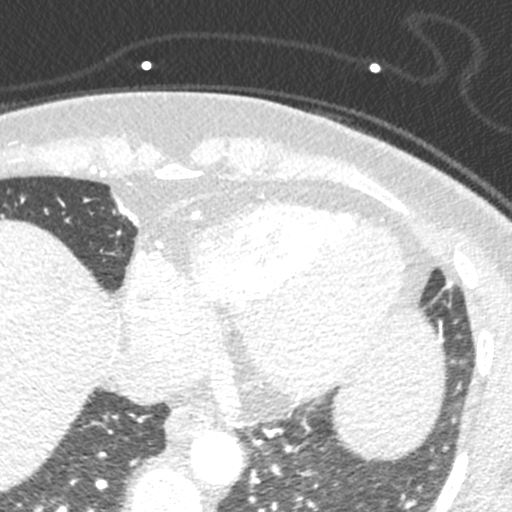
[im 159/239  lung]
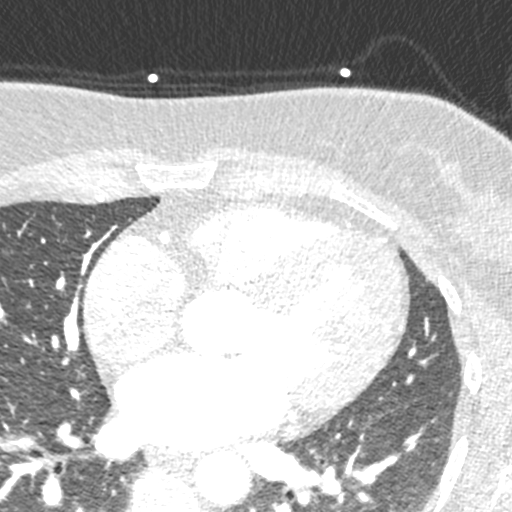

[Series 8: best syst · axial · 0.39mm/px · z∈[-58,-26]mm · 2 of 239 slices shown, 3 images]
[im 80/239  vessel]
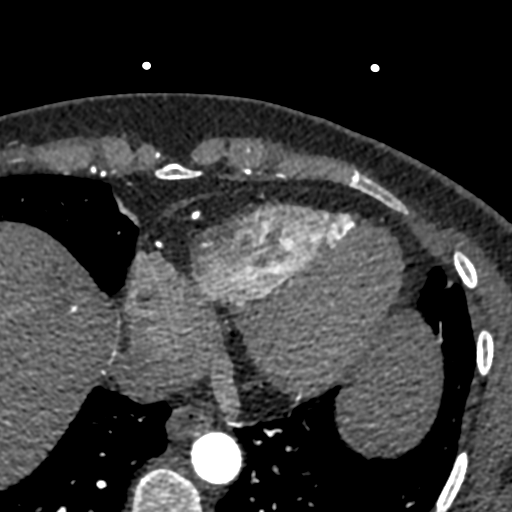
[im 80/239  lung]
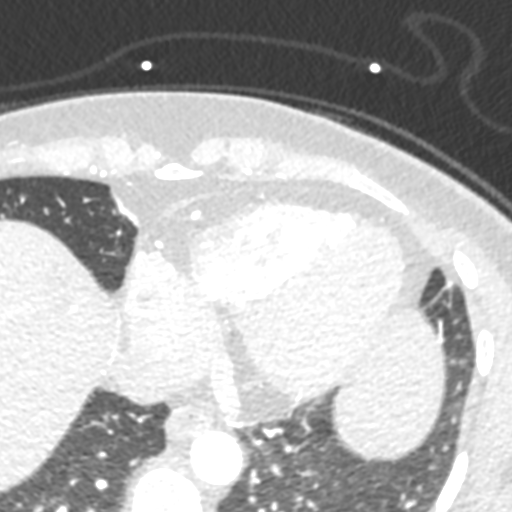
[im 159/239  vessel]
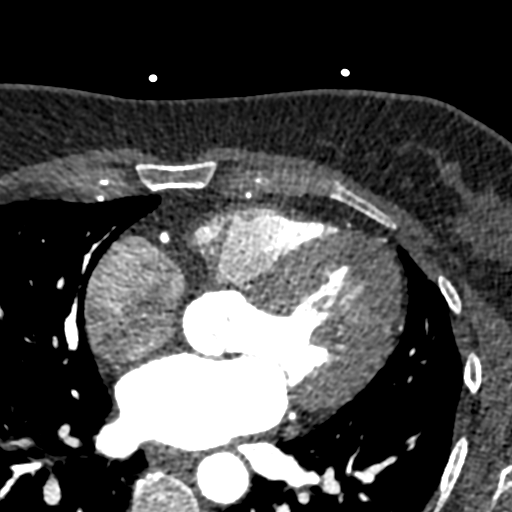

[Series 9: best diast · axial · 0.39mm/px · z∈[-58,-26]mm · 2 of 239 slices shown]
[im 80/239  vessel]
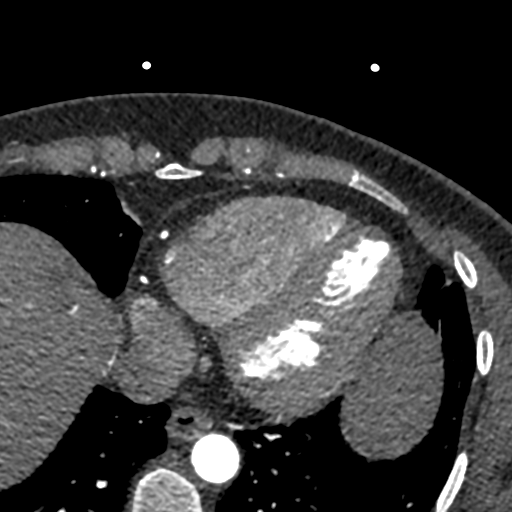
[im 159/239  vessel]
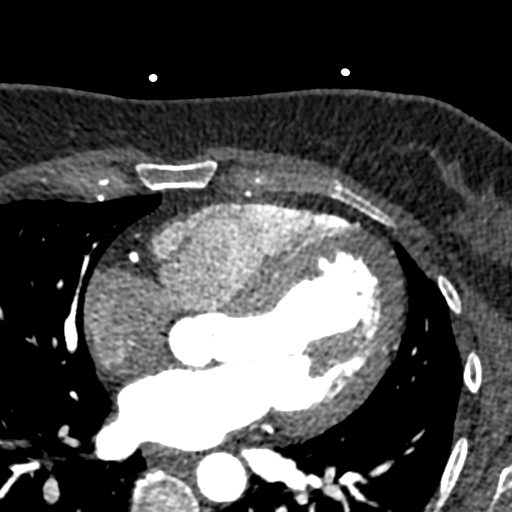

[8 of 20 positions shown; findings below may reference images not displayed]

FINDINGS: Vascular: Mild global cardiomegaly. The visualized thoracic aorta is
normal in caliber, absent of atherosclerotic calcification.

Mediastinum/Nodes: No enlarged mediastinal or axillary lymph nodes.
The visualized trachea and esophagus demonstrate no significant
findings.

Lungs/Pleura: Lungs are clear. No pleural effusion or pneumothorax.

Upper Abdomen: Within the posterior left lobe of the liver is a
hyperenhancing mass measuring approximately 3.5 x 2.9 cm. Similar
enhancing mass in the subcapsular segment 7 of the right lobe
measuring to 1.5 x 1.1 cm. There is an adjacent satellite
hyperenhancing focus just inferior and medial to the aforementioned
mass. Additionally, in segment 8 of the liver is a bilobed,
ill-defined hypo attenuating mass measuring approximately 1.9 x
cm. Liver is normal in size and contour. The visualized the
remaining upper abdomen is within normal limits.

Musculoskeletal: No chest wall mass or suspicious bone lesions
identified.
IMPRESSION: 1. Multifocal hyperenhancing liver masses, the largest in the left
lobe measuring up to 3.5 cm.
2. Indeterminate bilobed hypoenhancing right lobe liver mass
measuring up to 1.9 cm. While impression points 1 and 2 are favored
represent benign finding such as hemangiomas, MRI abdomen is
recommended for further characterization.
3. No acute or significant extra cardiac intrathoracic abnormality.
FINDINGS: A 100 kV prospective scan was triggered in the descending thoracic
aorta at 111 HU's. Axial non-contrast 3 mm slices were carried out
through the heart. The data set was analyzed on a dedicated work
station and scored using the Agatson method. Gantry rotation speed
was 250 msecs and collimation was .6 mm. No beta blockade and 0.8 mg
of sl NTG was given. The 3D data set was reconstructed in 5%
intervals of the 67-82 % of the R-R cycle. Diastolic phases were
analyzed on a dedicated work station using MPR, MIP and VRT modes.
The patient received 80 cc of contrast.

Aorta: Normal size.  No calcifications.  No dissection.

Aortic Valve:  Trileaflet.  No calcifications.

Coronary Arteries:  Normal coronary origin.  Right dominance.

RCA is a large dominant artery that gives rise to PDA and PLA. There
is no plaque.

Left main is a large artery that gives rise to LAD and LCX arteries.

LAD is a large vessel that has no plaque.

LCX is a non-dominant artery that gives rise to one large OM1
branch. There is no plaque.

Coronary Calcium Score:

Left main: 0

Left anterior descending artery: 0

Left circumflex artery: 0

Right coronary artery: 0

Total: 0

Percentile: 0

Other findings:

Normal pulmonary vein drainage into the left atrium.

Normal left atrial appendage without a thrombus.

Normal size of the pulmonary artery.
IMPRESSION: 1. Coronary calcium score of 0. This was 0 percentile for age and
sex matched control.

2. Normal coronary origin with right dominance.

3. No evidence of CAD.

CAD-RADS 0. No evidence of CAD (0%). Consider non-atherosclerotic
causes of chest pain.

*** End of Addendum ***
EXAM:
OVER-READ INTERPRETATION  CT CHEST

The following report is an over-read performed by radiologist Dr.
Yong Hoon Redfear [REDACTED] on 02/17/2022. This
over-read does not include interpretation of cardiac or coronary
anatomy or pathology. The coronary CTA interpretation by the
cardiologist is attached.
FINDINGS: Vascular: Mild global cardiomegaly. The visualized thoracic aorta is
normal in caliber, absent of atherosclerotic calcification.

Mediastinum/Nodes: No enlarged mediastinal or axillary lymph nodes.
The visualized trachea and esophagus demonstrate no significant
findings.

Lungs/Pleura: Lungs are clear. No pleural effusion or pneumothorax.

Upper Abdomen: Within the posterior left lobe of the liver is a
hyperenhancing mass measuring approximately 3.5 x 2.9 cm. Similar
enhancing mass in the subcapsular segment 7 of the right lobe
measuring to 1.5 x 1.1 cm. There is an adjacent satellite
hyperenhancing focus just inferior and medial to the aforementioned
mass. Additionally, in segment 8 of the liver is a bilobed,
ill-defined hypo attenuating mass measuring approximately 1.9 x
cm. Liver is normal in size and contour. The visualized the
remaining upper abdomen is within normal limits.

Musculoskeletal: No chest wall mass or suspicious bone lesions
identified.
IMPRESSION: 1. Multifocal hyperenhancing liver masses, the largest in the left
lobe measuring up to 3.5 cm.
2. Indeterminate bilobed hypoenhancing right lobe liver mass
measuring up to 1.9 cm. While impression points 1 and 2 are favored
represent benign finding such as hemangiomas, MRI abdomen is
recommended for further characterization.
3. No acute or significant extra cardiac intrathoracic abnormality.

## 2022-06-03 ENCOUNTER — Other Ambulatory Visit (HOSPITAL_COMMUNITY): Payer: Self-pay

## 2022-07-13 ENCOUNTER — Encounter (INDEPENDENT_AMBULATORY_CARE_PROVIDER_SITE_OTHER): Payer: Self-pay

## 2022-08-08 ENCOUNTER — Encounter: Payer: Self-pay | Admitting: Cardiology

## 2022-08-08 DIAGNOSIS — K769 Liver disease, unspecified: Secondary | ICD-10-CM

## 2022-08-08 DIAGNOSIS — R9389 Abnormal findings on diagnostic imaging of other specified body structures: Secondary | ICD-10-CM

## 2022-08-09 NOTE — Telephone Encounter (Signed)
    Please place an order for MRI with and without contrast of the abdomen/liver.  Indication: For liver mass   Tobb, Kardie, DO

## 2022-08-25 DIAGNOSIS — N6311 Unspecified lump in the right breast, upper outer quadrant: Secondary | ICD-10-CM | POA: Diagnosis not present

## 2022-08-25 DIAGNOSIS — Z9189 Other specified personal risk factors, not elsewhere classified: Secondary | ICD-10-CM | POA: Diagnosis not present

## 2022-09-01 DIAGNOSIS — Z803 Family history of malignant neoplasm of breast: Secondary | ICD-10-CM | POA: Diagnosis not present

## 2022-09-01 DIAGNOSIS — R922 Inconclusive mammogram: Secondary | ICD-10-CM | POA: Diagnosis not present

## 2022-09-05 ENCOUNTER — Telehealth: Payer: Self-pay | Admitting: Hematology and Oncology

## 2022-09-05 ENCOUNTER — Ambulatory Visit (HOSPITAL_COMMUNITY)
Admission: RE | Admit: 2022-09-05 | Discharge: 2022-09-05 | Disposition: A | Discharge status: Home / Self Care | Payer: 59 | Source: Ambulatory Visit | Attending: Cardiology | Admitting: Cardiology

## 2022-09-05 DIAGNOSIS — R9389 Abnormal findings on diagnostic imaging of other specified body structures: Secondary | ICD-10-CM | POA: Diagnosis not present

## 2022-09-05 DIAGNOSIS — K7689 Other specified diseases of liver: Secondary | ICD-10-CM | POA: Diagnosis not present

## 2022-09-05 DIAGNOSIS — R160 Hepatomegaly, not elsewhere classified: Secondary | ICD-10-CM | POA: Diagnosis not present

## 2022-09-05 DIAGNOSIS — K769 Liver disease, unspecified: Secondary | ICD-10-CM | POA: Insufficient documentation

## 2022-09-05 DIAGNOSIS — D1803 Hemangioma of intra-abdominal structures: Secondary | ICD-10-CM | POA: Diagnosis not present

## 2022-09-05 MED ORDER — GADOPICLENOL 0.5 MMOL/ML IV SOLN
7.5000 mL | Freq: Once | INTRAVENOUS | Status: AC | PRN
  Administered 2022-09-05: 7.5 mL via INTRAVENOUS

## 2022-09-05 NOTE — Telephone Encounter (Signed)
Scheduled appointment per 10/02. Patient is aware of appointment date and time. Patient is aware to arrive 15 mins prior to appointment time and to bring updated insurance cards. Patient is aware of location.   

## 2022-09-06 ENCOUNTER — Encounter: Payer: Self-pay | Admitting: Cardiology

## 2022-09-07 ENCOUNTER — Telehealth: Payer: Self-pay | Admitting: Gastroenterology

## 2022-09-07 NOTE — Telephone Encounter (Signed)
Good Afternoon Dr Bryan Lemma   We have received a request from patient to transfer care to you. I have already spoken with Dr Havery Moros and he has advised the ok. Please review and advise.  Thank you

## 2022-09-07 NOTE — Telephone Encounter (Signed)
That is okay with me if that is the patient's preference, if okay with Dr. Bryan Lemma.

## 2022-09-07 NOTE — Telephone Encounter (Signed)
Good morning Dr Havery Moros  We have received a request from patient to transfer care over to Dr Bryan Lemma. Please advise.  Thank you

## 2022-10-03 ENCOUNTER — Inpatient Hospital Stay: Payer: 59 | Attending: Hematology and Oncology | Admitting: Hematology and Oncology

## 2022-10-03 ENCOUNTER — Other Ambulatory Visit: Payer: Self-pay

## 2022-10-03 DIAGNOSIS — Z803 Family history of malignant neoplasm of breast: Secondary | ICD-10-CM | POA: Insufficient documentation

## 2022-10-03 DIAGNOSIS — Z79899 Other long term (current) drug therapy: Secondary | ICD-10-CM | POA: Diagnosis not present

## 2022-10-03 DIAGNOSIS — Z9189 Other specified personal risk factors, not elsewhere classified: Secondary | ICD-10-CM | POA: Insufficient documentation

## 2022-10-04 DIAGNOSIS — Z9189 Other specified personal risk factors, not elsewhere classified: Secondary | ICD-10-CM | POA: Insufficient documentation

## 2022-10-04 NOTE — Assessment & Plan Note (Addendum)
1.  Risk of breast cancer 10-year risk: 3.7% (average risk 2.1%) Lifetime risk: 17.4% (average risk 10.4%)  2. Risk reduction strategies: We discussed different treatment options and did not recommend tamoxifen for risk reduction.  3. Lifestyle modification: We discussed different interventions exercise at least 6 days a week including both aerobic as well as weight-bearing exercises and strength training. He discussed dietary modification including increasing number of servings of fruit and vegetables as well as decreased in number of servings of meat. We discussed the importance of maintaining a good BMI. Certainly patient eliminating or limiting alcohol consumption. We discussed smoking cessation are avoiding starting smoking habits.  4. Surveillance: Patient certainly would be a good candidate for ongoing mammograms on a yearly basis.   5. Genetics: due to family history I have recommended that she be seen by our genetic counselor for counseling and for possible testing.  6. Followup: Patient will be seen back on an as-needed basis.

## 2022-10-04 NOTE — Progress Notes (Signed)
[facility] CONSULT NOTE  Patient Care Team: [facility] [place] as PCP - General (Family Medicine) Berniece Salines, DO as PCP - Cardiology (Cardiology)  CHIEF COMPLAINTS/PURPOSE OF CONSULTATION:  Newly diagnosed breast cancer  HISTORY OF PRESENTING ILLNESS:  [name] 46 y.o. female is here because of recent diagnosis of high risk for breast cancer.  She had a family history of breast cancer and her mammogram the perform the Tyrer-Cuzick calculation and felt that she was high risk and was referred to [place] for further evaluation.  She has had a previous lumpectomy which was benign.  I reviewed her records extensively and collaborated the history with the patient.   MEDICAL HISTORY:  Past Medical History:  Diagnosis Date   ADD (attention deficit disorder)    Breast mass, right 04/2018   GERD (gastroesophageal reflux disease)    Hypertension    Seborrheic dermatitis    Vitamin D deficiency     SURGICAL HISTORY: Past Surgical History:  Procedure Laterality Date   BREAST LUMPECTOMY WITH RADIOACTIVE SEED LOCALIZATION Right 04/25/2018   Procedure: RIGHT BREAST LUMPECTOMY WITH RADIOACTIVE SEED LOCALIZATION ERAS PATHWAY;  Surgeon: Erroll Luna, MD;  Location: [place];  Service: General;  Laterality: Right;   CESAREAN SECTION  10/15/2006   CESAREAN SECTION  09/17/2008   CESAREAN SECTION     3 total   DILATION AND EVACUATION  12/10/2005   TONSILLECTOMY      SOCIAL HISTORY: Social History   Socioeconomic History   Marital status: Married    Spouse name: Programme researcher, broadcasting/film/video   Number of children: Not on file   Years of education: Not on file   Highest education level: Not on file  Occupational History   Occupation: RN labor & delivery  Tobacco Use   Smoking status: Never   Smokeless tobacco: Never  Vaping Use   Vaping Use: Never used  Substance and Sexual Activity   Alcohol use: Yes    Comment: occasionally   Drug use: Never    Sexual activity: Not on file  Other Topics Concern   Not on file  Social History Narrative   Not on file   Social Determinants of Health   Financial Resource Strain: Not on file  Food Insecurity: Not on file  Transportation Needs: Not on file  Physical Activity: Not on file  Stress: Not on file  Social Connections: Not on file  Intimate Partner Violence: Not on file    FAMILY HISTORY: Family History  Problem Relation Age of Onset   Diabetes Mother    High blood pressure Mother    Cancer Mother    Depression Mother    Anxiety disorder Mother    Bipolar disorder Mother    Drug abuse Mother    Obesity Mother    Alcoholism Father    Drug abuse Father    Obesity Father     ALLERGIES:  has No Known Allergies.  MEDICATIONS:  Current Outpatient Medications  Medication Sig Dispense Refill   metoprolol tartrate (LOPRESSOR) 100 MG tablet Take 1 tablet (100 mg total) by mouth 2 hours prior to CT 1 tablet 0   pantoprazole (PROTONIX) 40 MG tablet Take 1 tablet (40 mg total) by mouth daily. Take one tablet once daily for 6 weeks, then give [place] an update at [phone] 30 tablet 1   tretinoin (RETIN-A) 0.025 % cream Apply a small amount to face every night 20 g 3   Vitamin D, Ergocalciferol, (DRISDOL)  1.25 MG (50000 UNIT) CAPS capsule Take 1 capsule (50,000 Units total) by mouth every 7 (seven) days. 4 capsule 0   No current facility-administered medications for this visit.    REVIEW OF SYSTEMS:   Constitutional: Denies fevers, chills or abnormal night sweats Breast:  Denies any palpable lumps or discharge All other systems were reviewed with the patient and are negative.  PHYSICAL EXAMINATION: ECOG PERFORMANCE STATUS: 0 - Asymptomatic  Vitals:   10/03/22 1313  BP: 138/85  Pulse: 70  Resp: 18  Temp: (!) 97.5 F (36.4 C)  SpO2: 100%   Filed Weights   10/03/22 1313  Weight: 188 lb 8 oz (85.5 kg)    GENERAL:alert, no distress and comfortable    LABORATORY DATA:  I  have reviewed the data as listed Lab Results  Component Value Date   WBC 8.2 12/06/2021   HGB 14.3 12/06/2021   HCT 44.1 12/06/2021   MCV 90.6 12/06/2021   PLT 278 12/06/2021   Lab Results  Component Value Date   NA 142 01/25/2022   K 4.2 01/25/2022   CL 104 01/25/2022   CO2 26 01/25/2022    RADIOGRAPHIC STUDIES: I have personally reviewed the radiological reports and agreed with the findings in the report.  ASSESSMENT AND PLAN:  At high risk for breast cancer 1.  Risk of breast cancer 10-year risk: 3.7% (average risk 2.1%) Lifetime risk: 17.4% (average risk 10.4%)  2. Risk reduction strategies: We discussed different treatment options and did not recommend tamoxifen for risk reduction.  3. Lifestyle modification: We discussed different interventions exercise at least 6 days a week including both aerobic as well as weight-bearing exercises and strength training. He discussed dietary modification including increasing number of servings of fruit and vegetables as well as decreased in number of servings of meat. We discussed the importance of maintaining a good BMI. Certainly patient eliminating or limiting alcohol consumption. We discussed smoking cessation are avoiding starting smoking habits.  4. Surveillance: Patient certainly would be a good candidate for ongoing mammograms on a yearly basis.   5. Genetics: due to family history I have recommended that she be seen by our genetic counselor for counseling and for possible testing.  6. Followup: Patient will be seen back on an as-needed basis.   All questions were answered. The patient knows to call the clinic with any problems, questions or concerns.    Harriette Ohara, MD 10/04/22

## 2022-10-25 ENCOUNTER — Other Ambulatory Visit (HOSPITAL_BASED_OUTPATIENT_CLINIC_OR_DEPARTMENT_OTHER): Payer: Self-pay

## 2022-10-25 DIAGNOSIS — Z6836 Body mass index (BMI) 36.0-36.9, adult: Secondary | ICD-10-CM | POA: Diagnosis not present

## 2022-10-25 DIAGNOSIS — F909 Attention-deficit hyperactivity disorder, unspecified type: Secondary | ICD-10-CM | POA: Diagnosis not present

## 2022-10-25 DIAGNOSIS — E559 Vitamin D deficiency, unspecified: Secondary | ICD-10-CM | POA: Diagnosis not present

## 2022-10-25 MED ORDER — LISDEXAMFETAMINE DIMESYLATE 20 MG PO CAPS
20.0000 mg | ORAL_CAPSULE | Freq: Every morning | ORAL | 0 refills | Status: DC
  Filled 2022-10-25: qty 30, 30d supply, fill #0

## 2022-10-25 MED ORDER — VITAMIN D3 50 MCG (2000 UT) PO CAPS
2000.0000 [IU] | ORAL_CAPSULE | Freq: Every day | ORAL | 3 refills | Status: AC
  Filled 2022-10-25: qty 100, 100d supply, fill #0

## 2022-11-01 ENCOUNTER — Other Ambulatory Visit (HOSPITAL_COMMUNITY): Payer: Self-pay

## 2022-11-01 ENCOUNTER — Encounter: Payer: Self-pay | Admitting: Gastroenterology

## 2022-11-01 ENCOUNTER — Ambulatory Visit (INDEPENDENT_AMBULATORY_CARE_PROVIDER_SITE_OTHER): Payer: 59 | Admitting: Gastroenterology

## 2022-11-01 VITALS — BP 126/86 | HR 80 | Ht 61.0 in | Wt 191.0 lb

## 2022-11-01 DIAGNOSIS — K7689 Other specified diseases of liver: Secondary | ICD-10-CM | POA: Diagnosis not present

## 2022-11-01 DIAGNOSIS — Z1211 Encounter for screening for malignant neoplasm of colon: Secondary | ICD-10-CM

## 2022-11-01 DIAGNOSIS — D1803 Hemangioma of intra-abdominal structures: Secondary | ICD-10-CM | POA: Diagnosis not present

## 2022-11-01 DIAGNOSIS — R0989 Other specified symptoms and signs involving the circulatory and respiratory systems: Secondary | ICD-10-CM | POA: Diagnosis not present

## 2022-11-01 MED ORDER — CLENPIQ 10-3.5-12 MG-GM -GM/175ML PO SOLN
1.0000 | Freq: Once | ORAL | 0 refills | Status: AC
  Filled 2022-11-01 – 2023-02-17 (×3): qty 350, 1d supply, fill #0

## 2022-11-01 NOTE — Patient Instructions (Addendum)
We have sent the following medications to your pharmacy for you to pick up at your convenience: Clenpiq  You have been scheduled for an endoscopy and colonoscopy. Please follow the written instructions given to you at your visit today. Please pick up your prep supplies at the pharmacy within the next 1-3 days. If you use inhalers (even only as needed), please bring them with you on the day of your procedure.   If you are age 44 or younger, your body mass index should be between 19-25. Your Body mass index is 36.09 kg/m. If this is out of the aformentioned range listed, please consider follow up with your Primary Care Provider.   __________________________________________________________  The [facility] providers would like to encourage you to use [facility] to communicate with providers for non-urgent requests or questions.  Due to long hold times on the telephone, sending your provider a message by [facility] may be a faster and more efficient way to get a response.  Please allow 48 business hours for a response.  Please remember that this is for non-urgent requests.   Due to recent changes in healthcare laws, you may see the results of your imaging and laboratory studies on MyChart before your provider has had a chance to review them.  We understand that in some cases there may be results that are confusing or concerning to you. Not all laboratory results come back in the same time frame and the provider may be waiting for multiple results in order to interpret others.  Please give [place] 48 hours in order for your provider to thoroughly review all the results before contacting the office for clarification of your results.     Thank you for choosing me and [facility].  Vito Cirigliano, D.O.

## 2022-11-01 NOTE — Progress Notes (Signed)
Chief Complaint:    Abnormal imaging of the liver   HPI:     Patient is a 46 y.o. 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  -02/17/2022: CT Coronary: Normal coronary calcium score, no CAD.  Incidentally noted 3.5 x 2.9 cm hypoenhancing mass in the posterior left lobe of the liver, 1.5 x 1.1 cm hyperenhancing mass in the right lobe of the liver with adjacent hypoenhancing focus just inferior and medial, ill-defined 1.9 x 1.2 cm hypoattenuating bilobed mass in segment 8 of the liver.  Otherwise normal-appearing liver size and contour. - 09/05/2022: MRI abdomen: 2 cm bilobed lesion in segment 4A of the left lobe c/w benign hemangioma.  2 tiny subcentimeter cysts in the right hepatic lobe.  3.4 x 3.2 cm mass in the central liver along the anterior aspect of the IVC consistent with benign focal nodular hyperplasia.  2 adjacent hyperenhancing lesions in the lateral right hepatic lobe measuring 1.7 cm and 0.6 cm, also consistent with benign focal nodular hyperplasia.  Each of these lesions correspond to the previous CT.  No duct dilatation.  Normal GB, pancreas, spleen.  She is otherwise without any known personal or family history of liver disease.  No history of ascites, jaundice, icteric sclera.   Separately, she was evaluated earlier this year by Dr. Havery Moros for sporadic left-sided chest pain which occurs once every 6-12 months, lasting a few seconds and resolving on its own.  Negative work-up to include EKG, CXR, troponins, then followed up with Dr. Harriet Masson in the Cardiology Clinic with negative cardiac CT (incidental abnormal liver findings as above).    Does have frequent throat clearing x3 years or so, and occasional regurgitation but no heartburn, hoarseness, dysphagia, change in bowel habits, overt GI bleeding.  Dr. Havery Moros recommended trial of Protonix 40 mg/daily x6 weeks for  diagnostic/therapeutic intent to evaluate for improvement in throat clearing with possibility for ENT referral, EGD, pH/impedance, Bravo, etc. depending on response to therapy.  She report throat clearing improved, but not compeltely resolved. Took Protonix for 6 weeks, and was 90% better when on PPI. Still ~75% improved now despite being on PPI. Rare regurgitation.  No dysphagia.    Review of systems:     No chest pain, no SOB, no fevers, no urinary sx   Past Medical History:  Diagnosis Date   ADD (attention deficit disorder)    Breast mass, right 04/2018   GERD (gastroesophageal reflux disease)    Hypertension    Seborrheic dermatitis    Vitamin D deficiency     Patient's surgical history, family medical history, social history, medications and allergies were all reviewed in Epic    Current Outpatient Medications  Medication Sig Dispense Refill   Cholecalciferol (VITAMIN D3) 50 MCG (2000 UT) capsule Take 1 capsule (2,000 Units total) by mouth daily. 100 capsule 3   lisdexamfetamine (VYVANSE) 20 MG capsule Take 1 capsule (20 mg total) by mouth in the morning. 30 capsule 0   metoprolol tartrate (LOPRESSOR) 100 MG tablet Take 1 tablet (100 mg total) by mouth 2 hours prior to CT 1 tablet 0   pantoprazole (PROTONIX) 40 MG tablet Take 1 tablet (40 mg total) by mouth daily. Take one tablet once daily for 6 weeks, then give [place] an update at [phone] 30 tablet 1   tretinoin (RETIN-A) 0.025 % cream Apply a small amount to face every night 20  g 3   Vitamin D, Ergocalciferol, (DRISDOL) 1.25 MG (50000 UNIT) CAPS capsule Take 1 capsule (50,000 Units total) by mouth every 7 (seven) days. 4 capsule 0   No current facility-administered medications for this visit.    Physical Exam:     There were no vitals taken for this visit.  GENERAL:  Pleasant female in NAD PSYCH: : Cooperative, normal affect EENT:  conjunctiva pink, mucous membranes moist, neck supple without masses CARDIAC:  RRR, no  murmur heard, no peripheral edema PULM: Normal respiratory effort, lungs CTA bilaterally, no wheezing ABDOMEN:  Nondistended, soft, nontender. No obvious masses, no hepatomegaly,  normal bowel sounds SKIN:  turgor, no lesions seen Musculoskeletal:  Normal muscle tone, normal strength NEURO: Alert and oriented x 3, no focal neurologic deficits   IMPRESSION and PLAN:    1) GERD 2) Throat clearing - EGD with Bravo (off PPI therapy) - Discussed role/utility of antireflux surgery, specifically TIF - If the above work-up unrevealing and continued increased throat clearing, plan for ENT referral  3) CRC screening Due for age-appropriate CRC screening - Schedule colonoscopy  4) Hepatic Hemangioma 5) Hepatic FNH Discussed CT coronary and recent MRI findings at length today.  These are notable for benign hepatic hemangiomas and hepatic FNH.  Discussed the pathophysiology of each of these.  Each of these are benign lesions and overall size stable on imaging studies conducted without 6 months apart. - No repeat imaging needed     The indications, risks, and benefits of EGD and colonoscopy were explained to the patient in detail. Risks include but are not limited to bleeding, perforation, adverse reaction to medications, and cardiopulmonary compromise. Sequelae include but are not limited to the possibility of surgery, hospitalization, and mortality. The patient verbalized understanding and wished to proceed. All questions answered, referred to scheduler and bowel prep ordered. Further recommendations pending results of the exam.         Dominic Pea Anaka Beazer ,DO, FACG 11/01/2022, 11:22 AM

## 2022-11-11 ENCOUNTER — Other Ambulatory Visit (HOSPITAL_COMMUNITY): Payer: Self-pay

## 2022-12-02 ENCOUNTER — Other Ambulatory Visit (HOSPITAL_BASED_OUTPATIENT_CLINIC_OR_DEPARTMENT_OTHER): Payer: Self-pay

## 2022-12-02 DIAGNOSIS — Z6836 Body mass index (BMI) 36.0-36.9, adult: Secondary | ICD-10-CM | POA: Diagnosis not present

## 2022-12-02 DIAGNOSIS — F909 Attention-deficit hyperactivity disorder, unspecified type: Secondary | ICD-10-CM | POA: Diagnosis not present

## 2022-12-02 MED ORDER — LISDEXAMFETAMINE DIMESYLATE 30 MG PO CAPS
30.0000 mg | ORAL_CAPSULE | Freq: Every morning | ORAL | 0 refills | Status: DC
  Filled 2022-12-02: qty 30, 30d supply, fill #0

## 2022-12-06 ENCOUNTER — Telehealth: Payer: Self-pay | Admitting: Genetic Counselor

## 2022-12-06 NOTE — Telephone Encounter (Signed)
Called patient per 1/2 high priority in basket. Patient wanted upcoming appointments cancelled for insurance reasons. Will call back at later time to r/s appointments.

## 2022-12-08 ENCOUNTER — Inpatient Hospital Stay: Payer: Commercial Managed Care - PPO | Admitting: Genetic Counselor

## 2022-12-08 ENCOUNTER — Inpatient Hospital Stay: Payer: Commercial Managed Care - PPO

## 2022-12-30 ENCOUNTER — Encounter: Payer: Self-pay | Admitting: Gastroenterology

## 2023-01-02 DIAGNOSIS — Z6836 Body mass index (BMI) 36.0-36.9, adult: Secondary | ICD-10-CM | POA: Diagnosis not present

## 2023-01-02 DIAGNOSIS — F909 Attention-deficit hyperactivity disorder, unspecified type: Secondary | ICD-10-CM | POA: Diagnosis not present

## 2023-01-06 LAB — GLUCOSE, POCT (MANUAL RESULT ENTRY): POC Glucose: 101 mg/dl — AB (ref 70–99)

## 2023-01-16 ENCOUNTER — Encounter: Payer: Self-pay | Admitting: *Deleted

## 2023-01-16 NOTE — Progress Notes (Signed)
Pt has f/u with [place] PCP office in 12/23, 1/24, and, of note, has an appt with Dr. Rande Lawman at [facility] in 2wk. She participated only in the POCT glucose screening, which was WNL, at the 01/06/23 Women[facility] symposium. No SDOH barriers noted at the time of the symposium. No additional health equity team support indicated at this time.

## 2023-01-30 ENCOUNTER — Other Ambulatory Visit (HOSPITAL_COMMUNITY): Payer: Self-pay

## 2023-01-31 ENCOUNTER — Ambulatory Visit: Payer: 59 | Admitting: Internal Medicine

## 2023-01-31 ENCOUNTER — Encounter: Payer: Self-pay | Admitting: Internal Medicine

## 2023-01-31 VITALS — BP 152/103 | HR 75 | Temp 98.6°F | Ht 61.0 in | Wt 190.1 lb

## 2023-01-31 DIAGNOSIS — Z9189 Other specified personal risk factors, not elsewhere classified: Secondary | ICD-10-CM

## 2023-01-31 DIAGNOSIS — F900 Attention-deficit hyperactivity disorder, predominantly inattentive type: Secondary | ICD-10-CM

## 2023-01-31 DIAGNOSIS — R030 Elevated blood-pressure reading, without diagnosis of hypertension: Secondary | ICD-10-CM | POA: Diagnosis not present

## 2023-01-31 DIAGNOSIS — E559 Vitamin D deficiency, unspecified: Secondary | ICD-10-CM | POA: Diagnosis not present

## 2023-01-31 NOTE — Progress Notes (Signed)
New Patient Office Visit     CC/Reason for Visit: Establish care, discuss chronic medical conditions Previous PCP: F[facility] Has Last Visit: Unknown  HPI: [name] is a 47 y.o. female who is coming in today for the above mentioned reasons. Past Medical History is significant for: Obesity, GERD, ADHD, vitamin D deficiency.  She has been determined to be at high risk for breast cancer due to family history.  She has already met with oncology and is in the process of being set up with genetic counseling.  She takes Vyvanse for ADHD.  She is trying to wean herself off of it.  She has been noted to have elevated blood pressure in office today.  She believes she has had Tdap and will try and obtain records.  Her first colonoscopy is scheduled for March.  She states she had mammogram and Pap smear with Dr. Garwin Brothers, records will be obtained.   Past Medical/Surgical History: Past Medical History:  Diagnosis Date   ADD (attention deficit disorder)    Breast mass, right 04/2018   GERD (gastroesophageal reflux disease)    Hypertension    Seborrheic dermatitis    Vitamin D deficiency     Past Surgical History:  Procedure Laterality Date   BREAST LUMPECTOMY WITH RADIOACTIVE SEED LOCALIZATION Right 04/25/2018   Procedure: RIGHT BREAST LUMPECTOMY WITH RADIOACTIVE SEED LOCALIZATION ERAS PATHWAY;  Surgeon: Erroll Luna, MD;  Location: [facility];  Service: General;  Laterality: Right;   CESAREAN SECTION  10/15/2006   CESAREAN SECTION  09/17/2008   CESAREAN SECTION     3 total   DILATION AND EVACUATION  12/10/2005   TONSILLECTOMY      Social History:  reports that she has never smoked. She has never used smokeless tobacco. She reports current alcohol use. She reports that she does not use drugs.  Allergies: No Known Allergies  Family History:  Family History  Problem Relation Age of Onset   Diabetes Mother    High blood pressure Mother    Cancer Mother     Depression Mother    Anxiety disorder Mother    Bipolar disorder Mother    Drug abuse Mother    Obesity Mother    Alcoholism Father    Drug abuse Father    Obesity Father      Current Outpatient Medications:    Cholecalciferol (VITAMIN D3) 50 MCG (2000 UT) capsule, Take 1 capsule (2,000 Units total) by mouth daily., Disp: 100 capsule, Rfl: 3   lisdexamfetamine (VYVANSE) 30 MG capsule, Take 1 capsule (30 mg total) by mouth every morning., Disp: 30 capsule, Rfl: 0   Quercetin 50 MG TABS, Take by mouth., Disp: , Rfl:    tretinoin (RETIN-A) 0.025 % cream, Apply a small amount to face every night, Disp: 20 g, Rfl: 3   pantoprazole (PROTONIX) 40 MG tablet, Take 1 tablet (40 mg total) by mouth daily. Take one tablet once daily for 6 weeks, then give [place] an update at [phone] (Patient not taking: Reported on 01/31/2023), Disp: 30 tablet, Rfl: 1  Review of Systems:  Negative except as indicated in HPI.   Physical Exam: Vitals:   01/31/23 1420 01/31/23 1423  BP: (!) 148/98 (!) 152/103  Pulse: 75   Temp: 98.6 F (37 C)   TempSrc: Oral   SpO2: 98%   Weight: 190 lb 1.6 oz (86.2 kg)   Height: '5\' 1"'$  (1.549 m)    Body mass index is 35.92 kg/m.  Physical Exam Vitals reviewed.  Constitutional:      Appearance: Normal appearance.  HENT:     Head: Normocephalic and atraumatic.  Eyes:     Conjunctiva/sclera: Conjunctivae normal.     Pupils: Pupils are equal, round, and reactive to light.  Cardiovascular:     Rate and Rhythm: Normal rate and regular rhythm.  Pulmonary:     Effort: Pulmonary effort is normal.     Breath sounds: Normal breath sounds.  Skin:    General: Skin is warm and dry.  Neurological:     General: No focal deficit present.     Mental Status: She is alert and oriented to person, place, and time.  Psychiatric:        Mood and Affect: Mood normal.        Behavior: Behavior normal.        Thought Content: Thought content normal.        Judgment: Judgment normal.        Impression and Plan:  Elevated BP without diagnosis of hypertension  Vitamin D insufficiency  Morbid obesity (HCC)  At high risk for breast cancer  Attention deficit hyperactivity disorder (ADHD), predominantly inattentive type  -Blood pressure is noted to be elevated on 2 separate measurements.  She will do ambulatory blood pressure measurements and return in 6 to 8 weeks for follow-up. -Discussed healthy lifestyle, including increased physical activity and better food choices to promote weight loss.   Time spent: 46 minutes reviewing chart, interviewing and examining patient and formulating plan of care.     Lelon Frohlich, MD [facility] at [facility]

## 2023-02-07 ENCOUNTER — Ambulatory Visit: Payer: Self-pay | Admitting: Internal Medicine

## 2023-02-08 ENCOUNTER — Encounter: Payer: Self-pay | Admitting: Gastroenterology

## 2023-02-17 ENCOUNTER — Other Ambulatory Visit (HOSPITAL_BASED_OUTPATIENT_CLINIC_OR_DEPARTMENT_OTHER): Payer: Self-pay

## 2023-02-22 ENCOUNTER — Ambulatory Visit: Payer: 59 | Admitting: Gastroenterology

## 2023-02-22 ENCOUNTER — Encounter: Payer: Self-pay | Admitting: Gastroenterology

## 2023-02-22 VITALS — BP 120/88 | HR 81 | Resp 11

## 2023-02-22 DIAGNOSIS — Z1212 Encounter for screening for malignant neoplasm of rectum: Secondary | ICD-10-CM | POA: Diagnosis not present

## 2023-02-22 DIAGNOSIS — R0989 Other specified symptoms and signs involving the circulatory and respiratory systems: Secondary | ICD-10-CM

## 2023-02-22 DIAGNOSIS — R1110 Vomiting, unspecified: Secondary | ICD-10-CM

## 2023-02-22 DIAGNOSIS — R12 Heartburn: Secondary | ICD-10-CM

## 2023-02-22 DIAGNOSIS — Z1211 Encounter for screening for malignant neoplasm of colon: Secondary | ICD-10-CM

## 2023-02-22 DIAGNOSIS — R1310 Dysphagia, unspecified: Secondary | ICD-10-CM

## 2023-02-22 DIAGNOSIS — D128 Benign neoplasm of rectum: Secondary | ICD-10-CM | POA: Diagnosis not present

## 2023-02-22 DIAGNOSIS — I10 Essential (primary) hypertension: Secondary | ICD-10-CM | POA: Diagnosis not present

## 2023-02-22 MED ORDER — SODIUM CHLORIDE 0.9 % IV SOLN: 500.0000 mL | Freq: Once | INTRAVENOUS | Status: DC

## 2023-02-22 NOTE — Patient Instructions (Addendum)
NO PPI for the duration of the Bravo study  Post-op Bravo pH instructions Once you get home:  Eat normally and go about your daily routine/activities Limit drinking fluids or eating between meals Do not chew gum or eat hard candy DO NOT take any antacid or anti-reflux medications during the 48-hour monitoring time, unless instructed by your physician  Recording events: Events to be recorded are:  Record using event buttons on recorder and write on paper diary form Every time you eat or drink something (other than water) 2.   Periods of lying down/reclining 3.  Symptoms:  may include heartburn, regurgitation, chest pain, cough or specify if other.  A paper diary is also provided to record the times of your reflux symptoms and times for meals and when you lie down.  The recorder needs to remain within 3 feet (arms length) of you during the testing period (48 hours). If you should forget and move outside of a 3-foot radius of the receiver you may hear beeping and you will see a "C1" error in the display window on the top of the receiver.  Please pick up the receiver and hold close to you to re-establish the connection and the error message disappears.  You may take a bath/shower during the testing period, but the recorder must not get wet and must remain within 3 feet of you. Please leave the receiver outside of the shower or tub while bathing. The monitoring period will be for 48 hours after placement of the capsule.  At the end of the 48 hours, you will return the recorder, and your diary, to our [facility] front desk.  A nurse will meet you to collect the device and answer any questions you may have.  The device should turn off once the 48 hours is complete.   What to expect after placement of the capsule:  Some patients experience a vague sensation that something is in their esophagus or that they 'feel' the capsule when they swallow food.  Should you experience this,  chewing food carefully or drinking liquids may minimize this sensation.   After the test is complete, the disposable capsule will fall off the wall of your esophagus within 5-10 days and pass naturally with your bowel movement through the digestive tract.  Once the recorder is returned, your provider will review and interpret your recordings and contact you to discuss your results.  This may take up to two weeks.   DO NOT have an MRI for 30 days after your procedure to ensure the capsule is no longer inside your body  It is imperative that you return the recorder on _________________________ by 3:00pm.  Your information must be downloaded at this time to obtain your results.      YOU HAD AN ENDOSCOPIC PROCEDURE TODAY AT [facility]:   Refer to the procedure report that was given to you for any specific questions about what was found during the examination.  If the procedure report does not answer your questions, please call your gastroenterologist to clarify.  If you requested that your care partner not be given the details of your procedure findings, then the procedure report has been included in a sealed envelope for you to review at your convenience later.  YOU SHOULD EXPECT: Some feelings of bloating in the abdomen. Passage of more gas than usual.  Walking can help get rid of the air that was put into your GI tract during the procedure  and reduce the bloating. If you had a lower endoscopy (such as a colonoscopy or flexible sigmoidoscopy) you may notice spotting of blood in your stool or on the toilet paper. If you underwent a bowel prep for your procedure, you may not have a normal bowel movement for a few days.  Please Note:  You might notice some irritation and congestion in your nose or some drainage.  This is from the oxygen used during your procedure.  There is no need for concern and it should clear up in a day or so.  SYMPTOMS TO REPORT IMMEDIATELY:  Following lower  endoscopy (colonoscopy or flexible sigmoidoscopy):  Excessive amounts of blood in the stool  Significant tenderness or worsening of abdominal pains  Swelling of the abdomen that is new, acute  Fever of 100F or higher  Following upper endoscopy (EGD)  Vomiting of blood or coffee ground material  New chest pain or pain under the shoulder blades  Painful or persistently difficult swallowing  New shortness of breath  Fever of 100F or higher  Black, tarry-looking stools  For urgent or emergent issues, a gastroenterologist can be reached at any hour by calling [phone]. Do not use MyChart messaging for urgent concerns.    DIET:  We do recommend a small meal at first, but then you may proceed to your regular diet.  Drink plenty of fluids but you should avoid alcoholic beverages for 24 hours.  ACTIVITY:  You should plan to take it easy for the rest of today and you should NOT DRIVE or use heavy machinery until tomorrow (because of the sedation medicines used during the test).    FOLLOW UP: Our staff will call the number listed on your records the next business day following your procedure.  We will call around 7:15- 8:00 am to check on you and address any questions or concerns that you may have regarding the information given to you following your procedure. If we do not reach you, we will leave a message.     If any biopsies were taken you will be contacted by phone or by letter within the next 1-3 weeks.  Please call us at [phone] if you have not heard about the biopsies in 3 weeks.    SIGNATURES/CONFIDENTIALITY: You and/or your care partner have signed paperwork which will be entered into your electronic medical record.  These signatures attest to the fact that that the information above on your After Visit Summary has been reviewed and is understood.  Full responsibility of the confidentiality of this discharge information lies with you and/or your care-partner.

## 2023-02-22 NOTE — Op Note (Signed)
[place] Patient Name: [name] Procedure Date: 02/22/2023 9:03 AM MRN: [id number] Endoscopist: Gerrit Heck , MD, [id number] Age: 47 Referring MD:  Date of Birth: Nov 02, 1976 Gender: Female Account #: [id number] Procedure:                Colonoscopy Indications:              Screening for colorectal malignant neoplasm, This                            is the patient's first colonoscopy Medicines:                Monitored Anesthesia Care Procedure:                Pre-Anesthesia Assessment:                           - Prior to the procedure, a History and Physical                            was performed, and patient medications and                            allergies were reviewed. The patient's tolerance of                            previous anesthesia was also reviewed. The risks                            and benefits of the procedure and the sedation                            options and risks were discussed with the patient.                            All questions were answered, and informed consent                            was obtained. Prior Anticoagulants: The patient has                            taken no anticoagulant or antiplatelet agents. ASA                            Grade Assessment: II - A patient with mild systemic                            disease. After reviewing the risks and benefits,                            the patient was deemed in satisfactory condition to                            undergo the procedure.  After obtaining informed consent, the colonoscope                            was passed under direct vision. Throughout the                            procedure, the patient's blood pressure, pulse, and                            oxygen saturations were monitored continuously. The                            CF [id number] was introduced through the anus                            and advanced to the the  cecum, identified by                            appendiceal orifice and ileocecal valve. The                            colonoscopy was performed without difficulty. The                            patient tolerated the procedure well. The quality                            of the bowel preparation was good. The ileocecal                            valve, appendiceal orifice, and rectum were                            photographed. Scope In: 9:33:09 AM Scope Out: 9:44:10 AM Scope Withdrawal Time: 0 hours 9 minutes 17 seconds  Total Procedure Duration: 0 hours 11 minutes 1 second  Findings:                 The perianal and digital rectal examinations were                            normal.                           A 3 mm polyp was found in the rectum. The polyp was                            sessile. The polyp was removed with a cold snare.                            Resection and retrieval were complete. Estimated                            blood loss was minimal.  The exam was otherwise normal throughout the                            remainder of the colon.                           The retroflexed view of the distal rectum and anal                            verge was normal and showed no anal or rectal                            abnormalities. Complications:            No immediate complications. Estimated Blood Loss:     Estimated blood loss was minimal. Impression:               - One 3 mm polyp in the rectum, removed with a cold                            snare. Resected and retrieved.                           - The remainder of the colon was otherwise normal                            appearing.                           - The distal rectum and anal verge are normal on                            retroflexion view.                           - The GI Genius (intelligent endoscopy module),                            computer-aided polyp detection system  powered by AI                            was utilized to detect colorectal polyps through                            enhanced visualization during colonoscopy. Recommendation:           - Patient has a contact number available for                            emergencies. The signs and symptoms of potential                            delayed complications were discussed with the                            patient. Return to normal  activities tomorrow.                            Written discharge instructions were provided to the                            patient.                           - Resume previous diet.                           - Continue present medications.                           - Await pathology results.                           - Repeat colonoscopy in 7-10 years for surveillance                            based on pathology results. Gerrit Heck, MD 02/22/2023 9:54:50 AM

## 2023-02-22 NOTE — Op Note (Signed)
[place] [facility] Patient Name: [name] Procedure Date: 02/22/2023 9:03 AM MRN: [id number] Endoscopist: Gerrit Heck , MD, [id number] Age: 47 Referring MD:  Date of Birth: 10-01-1976 Gender: Female Account #: [id number] Procedure:                Upper GI endoscopy with Bravo placement (off PPI) Indications:              Suspected esophageal reflux, Chest pain (non                            cardiac), Regurgitation, Increased throat clearing Medicines:                Monitored Anesthesia Care Procedure:                Pre-Anesthesia Assessment:                           - Prior to the procedure, a History and Physical                            was performed, and patient medications and                            allergies were reviewed. The patient's tolerance of                            previous anesthesia was also reviewed. The risks                            and benefits of the procedure and the sedation                            options and risks were discussed with the patient.                            All questions were answered, and informed consent                            was obtained. Prior Anticoagulants: The patient has                            taken no anticoagulant or antiplatelet agents. ASA                            Grade Assessment: II - A patient with mild systemic                            disease. After reviewing the risks and benefits,                            the patient was deemed in satisfactory condition to                            undergo the procedure.  After obtaining informed consent, the endoscope was                            passed under direct vision. Throughout the                            procedure, the patient's blood pressure, pulse, and                            oxygen saturations were monitored continuously. The                            Olympus Scope [id number] was introduced through the                             mouth, and advanced to the second part of duodenum.                            The upper GI endoscopy was accomplished without                            difficulty. The patient tolerated the procedure                            well. Scope In: Scope Out: Findings:                 The examined esophagus was normal.                           The Z-line was regular and was found 37 cm from the                            incisors. The BRAVO capsule with delivery system                            was introduced through the mouth and advanced into                            the esophagus, such that the BRAVO pH capsule was                            positioned 31 cm from the incisors, which was 6 cm                            proximal to the GE junction. The BRAVO pH capsule                            was then deployed and attached to the esophageal                            mucosa. The delivery system was then withdrawn.  Endoscopy was utilized for probe placement and                            diagnostic evaluation. The scope was reinserted to                            evaluate placement of the BRAVO capsule.                            Visualization showed the BRAVO capsule to be in an                            appropriate position.                           The gastroesophageal flap valve was visualized                            endoscopically and classified as Hill Grade III                            (minimal fold, loose to endoscope, hiatal hernia                            likely).                           The entire examined stomach was normal.                           The examined duodenum was normal. Complications:            No immediate complications. Estimated Blood Loss:     Estimated blood loss: none. Impression:               - Normal esophagus.                           - Z-line regular, 37 cm from the incisors.                            - Gastroesophageal flap valve classified as Hill                            Grade III (minimal fold, loose to endoscope, hiatal                            hernia likely).                           - Normal stomach.                           - Normal examined duodenum.                           - The BRAVO pH capsule was deployed.                           -  No specimens collected. Recommendation:           - Patient has a contact number available for                            emergencies. The signs and symptoms of potential                            delayed complications were discussed with the                            patient. Return to normal activities tomorrow.                            Written discharge instructions were provided to the                            patient.                           - Resume previous diet.                           - Continue present medications.                           - No PPI for the duration of the Bravo study.                           - Return to GI clinic at appointment to be                            scheduled. Gerrit Heck, MD 02/22/2023 9:51:21 AM

## 2023-02-22 NOTE — Progress Notes (Signed)
Called to room to assist during endoscopic procedure.  Patient ID and intended procedure confirmed with present staff. Received instructions for my participation in the procedure from the performing physician.  

## 2023-02-22 NOTE — Progress Notes (Signed)
GASTROENTEROLOGY PROCEDURE H&P NOTE   Primary Care Physician: Isaac Bliss, Rayford Halsted, MD    Reason for Procedure:   Non-cardiac chest pain, throat clearing, regurgitation, colon cancer screening  Plan:    EGD with Bravo placement (off PPI), colonoscopy  Patient is appropriate for endoscopic procedure(s) in the ambulatory ([facility]) setting.  The nature of the procedure, as well as the risks, benefits, and alternatives were carefully and thoroughly reviewed with the patient. Ample time for discussion and questions allowed. The patient understood, was satisfied, and agreed to proceed.     HPI: [name] is a 47 y.o. female who presents for EGD with Bravo placement (off PPI) for evaluation of suspected GERD w/ throat clearing, regurgitation, NCCP, and pre-operative assessment, along with colonoscopy for CRC screening.    Past Medical History:  Diagnosis Date   ADD (attention deficit disorder)    Breast mass, right 04/2018   GERD (gastroesophageal reflux disease)    Hypertension    Seborrheic dermatitis    Vitamin D deficiency     Past Surgical History:  Procedure Laterality Date   BREAST LUMPECTOMY WITH RADIOACTIVE SEED LOCALIZATION Right 04/25/2018   Procedure: RIGHT BREAST LUMPECTOMY WITH RADIOACTIVE SEED LOCALIZATION ERAS PATHWAY;  Surgeon: Erroll Luna, MD;  Location: [facility];  Service: General;  Laterality: Right;   CESAREAN SECTION  10/15/2006   CESAREAN SECTION  09/17/2008   CESAREAN SECTION     3 total   DILATION AND EVACUATION  12/10/2005   TONSILLECTOMY      Prior to Admission medications   Medication Sig Start Date End Date Taking? Authorizing Provider  Cholecalciferol (VITAMIN D3) 50 MCG (2000 UT) capsule Take 1 capsule (2,000 Units total) by mouth daily. 10/25/22     lisdexamfetamine (VYVANSE) 30 MG capsule Take 1 capsule (30 mg total) by mouth every morning. 12/02/22     pantoprazole (PROTONIX) 40 MG tablet Take 1 tablet (40 mg  total) by mouth daily. Take one tablet once daily for 6 weeks, then give [place] an update at [phone] Patient not taking: Reported on 01/31/2023 02/02/22   Yetta Flock, MD  Quercetin 50 MG TABS Take by mouth.    [provider]  tretinoin (RETIN-A) 0.025 % cream Apply a small amount to face every night 10/13/21       Current Outpatient Medications  Medication Sig Dispense Refill   Cholecalciferol (VITAMIN D3) 50 MCG (2000 UT) capsule Take 1 capsule (2,000 Units total) by mouth daily. 100 capsule 3   lisdexamfetamine (VYVANSE) 30 MG capsule Take 1 capsule (30 mg total) by mouth every morning. 30 capsule 0   pantoprazole (PROTONIX) 40 MG tablet Take 1 tablet (40 mg total) by mouth daily. Take one tablet once daily for 6 weeks, then give [place] an update at [phone] (Patient not taking: Reported on 01/31/2023) 30 tablet 1   Quercetin 50 MG TABS Take by mouth.     tretinoin (RETIN-A) 0.025 % cream Apply a small amount to face every night 20 g 3   No current facility-administered medications for this visit.    Allergies as of 02/22/2023   (No Known Allergies)    Family History  Problem Relation Age of Onset   Diabetes Mother    High blood pressure Mother    Cancer Mother    Depression Mother    Anxiety disorder Mother    Bipolar disorder Mother    Drug abuse Mother    Obesity Mother    Alcoholism  Father    Drug abuse Father    Obesity Father     Social History   Socioeconomic History   Marital status: Married    Spouse name: Jamal   Number of children: Not on file   Years of education: Not on file   Highest education level: Not on file  Occupational History   Occupation: RN labor & delivery  Tobacco Use   Smoking status: Never   Smokeless tobacco: Never  Vaping Use   Vaping Use: Never used  Substance and Sexual Activity   Alcohol use: Yes    Comment: occasionally   Drug use: Never   Sexual activity: Not on file  Other Topics Concern   Not on file   Social History Narrative   Not on file   Social Determinants of Health   Financial Resource Strain: Not on file  Food Insecurity: No Food Insecurity (01/06/2023)   Hunger Vital Sign    Worried About Running Out of Food in the Last Year: Never true    [place] in the Last Year: Never true  Transportation Needs: No Transportation Needs (01/06/2023)   PRAPARE - Hydrologist (Medical): No    Lack of Transportation (Non-Medical): No  Physical Activity: Not on file  Stress: Not on file  Social Connections: Not on file  Intimate Partner Violence: Not At Risk (01/06/2023)   Humiliation, Afraid, Rape, and Kick questionnaire    Fear of Current or Ex-Partner: No    Emotionally Abused: No    Physically Abused: No    Sexually Abused: No    Physical Exam: Vital signs in last 24 hours: @There  were no vitals taken for this visit. GEN: NAD EYE: Sclerae anicteric ENT: MMM CV: Non-tachycardic Pulm: CTA b/l GI: Soft, NT/ND NEURO:  Alert & Oriented x 3   Gerrit Heck, DO [facility]   02/22/2023 8:44 AM

## 2023-02-22 NOTE — Progress Notes (Signed)
Patient coughing during EGD; otherwise uneventful. Patient to pacu. Report given to pacu rn. Vss. Care resumed by rn.

## 2023-02-22 NOTE — Progress Notes (Signed)
Capsule expiration date- 04/02/24   Capsule ID number [id number]  LES measurement: 37 (capsule placed 6 cm above LES) 31  Time of implant: 0930

## 2023-02-22 NOTE — Progress Notes (Signed)
[place] teaching completed and patient had no further questions

## 2023-02-23 ENCOUNTER — Telehealth: Payer: Self-pay

## 2023-02-23 NOTE — Progress Notes (Signed)
Duplicate chart

## 2023-02-23 NOTE — Telephone Encounter (Signed)
  Follow up Call-     02/22/2023    8:50 AM  Call back number  Post procedure Call Back phone  # [phone]  Permission to leave phone message Yes     Patient questions:  Do you have a fever, pain , or abdominal swelling? No. Pain Score  0 *  Have you tolerated food without any problems? Yes.    Have you been able to return to your normal activities? Yes.    Do you have any questions about your discharge instructions: Diet   No. Medications  No. Follow up visit  No.  Do you have questions or concerns about your Care? No.  Actions: * If pain score is 4 or above: No action needed, pain <4.

## 2023-03-17 ENCOUNTER — Telehealth: Payer: Self-pay | Admitting: Gastroenterology

## 2023-03-17 DIAGNOSIS — R0989 Other specified symptoms and signs involving the circulatory and respiratory systems: Secondary | ICD-10-CM

## 2023-03-17 NOTE — Telephone Encounter (Signed)
Please contact this patient to let her know that the Bravo study results were reviewed and notable for the following: - Test performed off PPI therapy  Interpretations: 1.  Normal esophageal acid exposure with % time pH <4 of 0.6%.  DeMeester score 2.7 2.  Esophageal exposure is normal in upright and supine position 3.  Positive symptom correlation for heartburn and regurgitation based on SAP  Impression: - No evidence of significant or pathologic gastroesophageal reflux disease - Results consistent with hypersensitive esophagus.  Recommendation: - Referral to ENT for evaluation of frequent throat clearing in the absence of GERD - May consider addition of neuromodulator.  However, as she is currently also prescribed Vyvanse, would need to discuss further as there is potential for medication interaction

## 2023-03-20 ENCOUNTER — Encounter: Payer: Self-pay | Admitting: Gastroenterology

## 2023-03-20 NOTE — Addendum Note (Signed)
Addended by: Coletta Memos on: 03/20/2023 08:59 AM   Modules accepted: Orders

## 2023-03-20 NOTE — Telephone Encounter (Signed)
Called patient and left detailed VM. [employer] as well. Referral to ENT - Dr. Pollyann Kennedy, Dorene Sorrow Has been sent.   [facility] [address], [address] [place], [place] [address] [phone]

## 2023-03-27 ENCOUNTER — Ambulatory Visit: Payer: 59 | Admitting: Internal Medicine

## 2023-04-07 ENCOUNTER — Ambulatory Visit: Payer: Self-pay | Admitting: Podiatry

## 2023-04-13 ENCOUNTER — Telehealth: Payer: Self-pay | Admitting: Internal Medicine

## 2023-04-13 NOTE — Telephone Encounter (Addendum)
Will also forward to Print production planner for review.Marland KitchenMarland Kitchen

## 2023-04-13 NOTE — Telephone Encounter (Addendum)
Pt states she received a letter stating she was a "No Show" for her 03/27/23 appointment. Pt states she did not miss this appointment. Pt states she called, spoke to DS, and rescheduled this appointment. Pt states she was rescheduled for 05/23/23 at 10:30 am. Pt states she was assured that she would not be charged a "no show" fee. Pt would like a call back to ensure she will not be charged and no show status expunged from her record.

## 2023-04-14 NOTE — Telephone Encounter (Signed)
Agreed -

## 2023-04-17 ENCOUNTER — Encounter: Payer: Self-pay | Admitting: Dermatology

## 2023-04-17 ENCOUNTER — Ambulatory Visit (INDEPENDENT_AMBULATORY_CARE_PROVIDER_SITE_OTHER): Payer: 59 | Admitting: Dermatology

## 2023-04-17 DIAGNOSIS — L219 Seborrheic dermatitis, unspecified: Secondary | ICD-10-CM

## 2023-04-17 MED ORDER — ZORYVE 0.3 % EX FOAM: 1.0000 | Freq: Every day | CUTANEOUS | 2 refills | Status: AC

## 2023-04-17 NOTE — Patient Instructions (Addendum)
Treatment Plan: -Starting Zoryve foam to use once a day until clear. Restart as needed for flares -Can start DHS Zinc shampoo. Let sit in the scalp for 2-3 minutes, add regular shampoo then rinse and condition as normal.    Your prescription was sent to [facility] in [place]. A representative from [employer] will contact you within 2 business hours to verify your address and insurance information to schedule a free delivery. If for any reason you do not receive a phone call from them, please reach out to them. Their phone number is [phone] and their hours are Monday-Friday 9:00 am-5:00 pm.    Due to recent changes in healthcare laws, you may see results of your pathology and/or laboratory studies on MyChart before the doctors have had a chance to review them. We understand that in some cases there may be results that are confusing or concerning to you. Please understand that not all results are received at the same time and often the doctors may need to interpret multiple results in order to provide you with the best plan of care or course of treatment. Therefore, we ask that you please give [place] 2 business days to thoroughly review all your results before contacting the office for clarification. Should we see a critical lab result, you will be contacted sooner.   If You Need Anything After Your Visit  If you have any questions or concerns for your doctor, please call our main line at [phone] If no one answers, please leave a voicemail as directed and we will return your call as soon as possible. Messages left after 4 pm will be answered the following business day.   You may also send [place] a message via MyChart. We typically respond to MyChart messages within 1-2 business days.  For prescription refills, please ask your pharmacy to contact our office. Our fax number is [phone].  If you have an urgent issue when the clinic is closed that cannot wait until the next business  day, you can page your doctor at the number below.    Please note that while we do our best to be available for urgent issues outside of office hours, we are not available 24/7.   If you have an urgent issue and are unable to reach [place], you may choose to seek medical care at your doctor's office, retail clinic, urgent care center, or emergency room.  If you have a medical emergency, please immediately call 911 or go to the emergency department. In the event of inclement weather, please call our main line at [phone] for an update on the status of any delays or closures.  Dermatology Medication Tips: Please keep the boxes that topical medications come in in order to help keep track of the instructions about where and how to use these. Pharmacies typically print the medication instructions only on the boxes and not directly on the medication tubes.   If your medication is too expensive, please contact our office at [phone] or send [place] a message through MyChart.   We are unable to tell what your co-pay for medications will be in advance as this is different depending on your insurance coverage. However, we may be able to find a substitute medication at lower cost or fill out paperwork to get insurance to cover a needed medication.   If a prior authorization is required to get your medication covered by your insurance company, please allow [place] 1-2 business days to complete this process.  Drug  prices often vary depending on where the prescription is filled and some pharmacies may offer cheaper prices.  The website www.goodrx.com contains coupons for medications through different pharmacies. The prices here do not account for what the cost may be with help from insurance (it may be cheaper with your insurance), but the website can give you the price if you did not use any insurance.  - You can print the associated coupon and take it with your prescription to the pharmacy.  - You may also stop  by our office during regular business hours and pick up a GoodRx coupon card.  - If you need your prescription sent electronically to a different pharmacy, notify our office through [facility] or by phone at [phone]

## 2023-04-17 NOTE — Progress Notes (Signed)
   New Patient Visit   Subjective  [name] is a 47 y.o. female who presents for the following: Pruritis  Has previous diagnosis of Seb Derm. Has had flares over the course of many years. Generally hairline, ears, scalp, around the nose. She is currently using Ketoconazole 2% cream, Hydrocortisone cream, and tretinoin. Has previously used a prescription shampoo. Treatment plan did not work that well. Currently not using anything. Not currently flared.   The following portions of the chart were reviewed this encounter and updated as appropriate: medications, allergies, medical history  Review of Systems:  No other skin or systemic complaints except as noted in HPI or Assessment and Plan.  Objective  Well appearing patient in no apparent distress; mood and affect are within normal limits.  A focused examination was performed of the following areas: Face and scalp  Relevant exam findings are noted in the Assessment and Plan.    Assessment & Plan   SEBORRHEIC DERMATITIS Exam: Pink patches with greasy scale   Not currently Flared  Seborrheic Dermatitis is a chronic persistent rash characterized by pinkness and scaling most commonly of the mid face but also can occur on the scalp (dandruff), ears; mid chest, mid back and groin.  It tends to be exacerbated by stress and cooler weather.  People who have neurologic disease may experience new onset or exacerbation of existing seborrheic dermatitis.  The condition is not curable but treatable and can be controlled.  Treatment Plan: -Starting Zoryve foam to use once a day until clear. Restart as needed for flares -Can start DHS Zinc shampoo. Let sit in the scalp for 2-3 minutes, add regular shampoo then rinse and condition as normal.    Return in about 3 months (around 07/18/2023) for seb derm f/u.    Documentation: I have reviewed the above documentation for accuracy and completeness, and I agree with the above.  Langston Reusing,  DO   I, Germaine Pomfret, CMA, am acting as scribe for [employer], DO.

## 2023-05-23 ENCOUNTER — Ambulatory Visit: Payer: 59 | Admitting: Dermatology

## 2023-05-23 ENCOUNTER — Encounter: Payer: 59 | Admitting: Internal Medicine

## 2023-07-18 ENCOUNTER — Ambulatory Visit: Payer: Managed Care, Other (non HMO) | Admitting: Dermatology

## 2023-07-18 ENCOUNTER — Encounter: Payer: Self-pay | Admitting: Dermatology

## 2023-07-18 DIAGNOSIS — B070 Plantar wart: Secondary | ICD-10-CM | POA: Diagnosis not present

## 2023-07-18 DIAGNOSIS — L219 Seborrheic dermatitis, unspecified: Secondary | ICD-10-CM

## 2023-07-18 NOTE — Progress Notes (Unsigned)
   Follow-Up Visit   Subjective  [name] is a 47 y.o. female who presents for the following: Follow up on Seborrheic Dermatitis around the scalp, last seen 04/17/23. Treatment with Zoryve foam once per day. She had a break in treatment for 1.5 months due to insurance reasons. Her symptoms are improving. She did not get the DHS Zinc shampoo.   She has a callus type of tender spot on the sole of her left foot x 6 months. It began after breaking a toe in December. She attributes it to walking differently after the broken toe. She has tried to shave it down herself.  The following portions of the chart were reviewed this encounter and updated as appropriate:      Review of Systems: No other skin or systemic complaints.  Objective  Well appearing patient in no apparent distress; mood and affect are within normal limits.  A focused examination was performed including left foot and scalp. Relevant physical exam findings are noted in the Assessment and Plan.  Left 2nd Metatarsal Plantar Area Verrucous papule      Assessment & Plan  Plantar wart Left 2nd Metatarsal Plantar Area  Destruction of lesion - Left 2nd Metatarsal Plantar Area Complexity: simple   Destruction method: cryotherapy   Informed consent: discussed and consent obtained   Timeout:  patient name, date of birth, surgical site, and procedure verified Lesion destroyed using liquid nitrogen: Yes   Post-procedure details: wound care instructions given    Destruction of lesion - Left 2nd Metatarsal Plantar Area  Destruction method: chemical removal   Chemical destruction method: cantharidin   Procedure instructions: patient instructed to wash and dry area   Post-procedure details: wound care instructions given     SEBORRHEIC DERMATITIS Exam: Pink patches with greasy scale at the scalp  Improved  Treatment Plan: Continue Zoryve foam daily as needed for flares Advised DHS Zinc shampoo 2 x weekly. Allow it to  sit on scalp for 2-3 minutes then rinse.     Jaclynn Guarneri, CMA, am acting as scribe for [employer], DO.  Return in about 6 weeks (around 08/29/2023) for Wart Follow UP, Seborrheic Dermatitis Follow Up.

## 2023-07-18 NOTE — Patient Instructions (Addendum)
Hi [name],  Thank you for visiting my office today. Your dedication to addressing your dermatological concerns and improving your health is greatly appreciated. Below is a summary of the essential instructions from today's consultation:  - Foot Care: Treatment for the Wart / callus-like spot on your left foot, suspected to be an endophytic wart, included:   - Paring: Removal of dead skin.   - Liquid Nitrogen Application: To freeze the area, which may cause slight stinging and burning.    - Cantharone Application: Covered and left for 3 hours to blister the skin and facilitate core removal.      - Post-Treatment Care: Keep the area covered for three hours, then wash off with soap and water, apply a bandaid, and anticipate blistering.      - Maintenance: Use an [employer] as needed to remove dead skin.   - Follow-Up: A reassessment is scheduled in six weeks.  - Scalp Care:   - Shampoo: Continue using [employer] Z[employer] shampoo, available at the pharmacy downstairs for approximately $10.   - Foam Usage: Reduce to as-needed basis to prolong product life.  Please adhere to these instructions carefully and do not hesitate to report any issues or concerns that may arise. I look forward to our follow-up in six weeks to monitor your progress.  Warm regards,  Dr. Langston Reusing, Dermatologist        Instructions for the foot: Prior to procedure discussed that Cantharidin is a blistering agent that comes from a beetle.  It needs to be washed off in about 3 hours after application.  Although it is painless when applied in office, it may cause symptoms of mild pain and burning several hours later.  Treated areas will swell and turn red, and blisters may form.  Vaseline and a bandaid may be applied until wound has healed.  Once healed, the skin may remain temporarily discolored.  It can take weeks to months for pigmentation to return to normal.     Due to recent changes in healthcare laws, you may see  results of your pathology and/or laboratory studies on MyChart before the doctors have had a chance to review them. We understand that in some cases there may be results that are confusing or concerning to you. Please understand that not all results are received at the same time and often the doctors may need to interpret multiple results in order to provide you with the best plan of care or course of treatment. Therefore, we ask that you please give [place] 2 business days to thoroughly review all your results before contacting the office for clarification. Should we see a critical lab result, you will be contacted sooner.   If You Need Anything After Your Visit  If you have any questions or concerns for your doctor, please call our main line at [phone] If no one answers, please leave a voicemail as directed and we will return your call as soon as possible. Messages left after 4 pm will be answered the following business day.   You may also send [place] a message via MyChart. We typically respond to MyChart messages within 1-2 business days.  For prescription refills, please ask your pharmacy to contact our office. Our fax number is [phone].  If you have an urgent issue when the clinic is closed that cannot wait until the next business day, you can page your doctor at the number below.    Please note that while we do our best to  be available for urgent issues outside of office hours, we are not available 24/7.   If you have an urgent issue and are unable to reach [place], you may choose to seek medical care at your doctor's office, retail clinic, urgent care center, or emergency room.  If you have a medical emergency, please immediately call 911 or go to the emergency department. In the event of inclement weather, please call our main line at [phone] for an update on the status of any delays or closures.  Dermatology Medication Tips: Please keep the boxes that topical medications come in in  order to help keep track of the instructions about where and how to use these. Pharmacies typically print the medication instructions only on the boxes and not directly on the medication tubes.   If your medication is too expensive, please contact our office at [phone] or send [place] a message through MyChart.   We are unable to tell what your co-pay for medications will be in advance as this is different depending on your insurance coverage. However, we may be able to find a substitute medication at lower cost or fill out paperwork to get insurance to cover a needed medication.   If a prior authorization is required to get your medication covered by your insurance company, please allow [place] 1-2 business days to complete this process.  Drug prices often vary depending on where the prescription is filled and some pharmacies may offer cheaper prices.  The website www.goodrx.com contains coupons for medications through different pharmacies. The prices here do not account for what the cost may be with help from insurance (it may be cheaper with your insurance), but the website can give you the price if you did not use any insurance.  - You can print the associated coupon and take it with your prescription to the pharmacy.  - You may also stop by our office during regular business hours and pick up a GoodRx coupon card.  - If you need your prescription sent electronically to a different pharmacy, notify our office through [facility] or by phone at [phone]

## 2023-07-20 ENCOUNTER — Encounter: Payer: Self-pay | Admitting: Dermatology

## 2023-07-20 ENCOUNTER — Encounter (INDEPENDENT_AMBULATORY_CARE_PROVIDER_SITE_OTHER): Payer: Self-pay

## 2023-07-20 NOTE — Telephone Encounter (Signed)
It's normal for the area to be tender.  She should take a break from the [employer] and apply Vaseline and a "blister Band-Aid" since they have more cushion then a regular Band-Aid.

## 2023-08-16 ENCOUNTER — Other Ambulatory Visit (HOSPITAL_BASED_OUTPATIENT_CLINIC_OR_DEPARTMENT_OTHER): Payer: Self-pay

## 2023-08-16 ENCOUNTER — Other Ambulatory Visit (HOSPITAL_COMMUNITY): Payer: Self-pay

## 2023-08-16 MED ORDER — CLOBETASOL PROPIONATE 0.05 % EX CREA
1.0000 | TOPICAL_CREAM | CUTANEOUS | 3 refills | Status: AC
  Filled 2023-08-16: qty 30, 30d supply, fill #0
  Filled 2023-08-16: qty 30, 10d supply, fill #0
  Filled 2023-12-07: qty 30, 30d supply, fill #1

## 2023-08-16 MED ORDER — HYDROXYZINE HCL 25 MG PO TABS
25.0000 mg | ORAL_TABLET | Freq: Every day | ORAL | 1 refills | Status: AC
  Filled 2023-08-16 (×2): qty 30, 30d supply, fill #0

## 2023-08-22 ENCOUNTER — Encounter: Payer: Managed Care, Other (non HMO) | Admitting: Internal Medicine

## 2023-08-30 ENCOUNTER — Ambulatory Visit: Payer: Managed Care, Other (non HMO) | Admitting: Dermatology

## 2023-10-09 ENCOUNTER — Ambulatory Visit (INDEPENDENT_AMBULATORY_CARE_PROVIDER_SITE_OTHER): Payer: 59 | Admitting: Internal Medicine

## 2023-10-09 ENCOUNTER — Encounter: Payer: Self-pay | Admitting: Internal Medicine

## 2023-10-09 VITALS — BP 130/88 | HR 88 | Temp 98.1°F | Ht 60.5 in | Wt 182.4 lb

## 2023-10-09 DIAGNOSIS — Z0000 Encounter for general adult medical examination without abnormal findings: Secondary | ICD-10-CM

## 2023-10-09 DIAGNOSIS — Z23 Encounter for immunization: Secondary | ICD-10-CM | POA: Diagnosis not present

## 2023-10-09 DIAGNOSIS — E559 Vitamin D deficiency, unspecified: Secondary | ICD-10-CM

## 2023-10-09 DIAGNOSIS — R030 Elevated blood-pressure reading, without diagnosis of hypertension: Secondary | ICD-10-CM | POA: Diagnosis not present

## 2023-10-09 DIAGNOSIS — Z1159 Encounter for screening for other viral diseases: Secondary | ICD-10-CM

## 2023-10-09 DIAGNOSIS — Z114 Encounter for screening for human immunodeficiency virus [HIV]: Secondary | ICD-10-CM

## 2023-10-09 LAB — COMPREHENSIVE METABOLIC PANEL
ALT: 18 U/L (ref 0–35)
AST: 17 U/L (ref 0–37)
Albumin: 4.9 g/dL (ref 3.5–5.2)
Alkaline Phosphatase: 66 U/L (ref 39–117)
BUN: 11 mg/dL (ref 6–23)
CO2: 26 meq/L (ref 19–32)
Calcium: 10.4 mg/dL (ref 8.4–10.5)
Chloride: 101 meq/L (ref 96–112)
Creatinine, Ser: 0.8 mg/dL (ref 0.40–1.20)
GFR: 89.02 mL/min (ref >60.00)
Glucose, Bld: 89 mg/dL (ref 70–99)
Potassium: 3.5 meq/L (ref 3.5–5.1)
Sodium: 138 meq/L (ref 135–145)
Total Bilirubin: 0.8 mg/dL (ref 0.2–1.2)
Total Protein: 7.8 g/dL (ref 6.0–8.3)

## 2023-10-09 LAB — CBC WITH DIFFERENTIAL/PLATELET
Basophils Absolute: 0 10*3/uL (ref 0.0–0.1)
Basophils Relative: 0.3 % (ref 0.0–3.0)
Eosinophils Absolute: 0 10*3/uL (ref 0.0–0.7)
Eosinophils Relative: 0.5 % (ref 0.0–5.0)
HCT: 44.1 % (ref 36.0–46.0)
Hemoglobin: 14.3 g/dL (ref 12.0–15.0)
Lymphocytes Relative: 29.3 % (ref 12.0–46.0)
Lymphs Abs: 2.3 10*3/uL (ref 0.7–4.0)
MCHC: 32.5 g/dL (ref 30.0–36.0)
MCV: 89.9 fL (ref 78.0–100.0)
Monocytes Absolute: 0.4 10*3/uL (ref 0.1–1.0)
Monocytes Relative: 5.2 % (ref 3.0–12.0)
Neutro Abs: 5 10*3/uL (ref 1.4–7.7)
Neutrophils Relative %: 64.7 % (ref 43.0–77.0)
Platelets: 279 10*3/uL (ref 150.0–400.0)
RBC: 4.9 Mil/uL (ref 3.87–5.11)
RDW: 13.3 % (ref 11.5–15.5)
WBC: 7.7 10*3/uL (ref 4.0–10.5)

## 2023-10-09 LAB — LIPID PANEL
Cholesterol: 182 mg/dL (ref 0–200)
HDL: 55.7 mg/dL (ref >39.00)
LDL Cholesterol: 111 mg/dL — ABNORMAL HIGH (ref 0–99)
NonHDL: 125.84
Total CHOL/HDL Ratio: 3
Triglycerides: 75 mg/dL (ref 0.0–149.0)
VLDL: 15 mg/dL (ref 0.0–40.0)

## 2023-10-09 LAB — TSH: TSH: 1.73 u[IU]/mL (ref 0.35–5.50)

## 2023-10-09 LAB — VITAMIN D 25 HYDROXY (VIT D DEFICIENCY, FRACTURES): VITD: 29.18 ng/mL — ABNORMAL LOW (ref 30.00–100.00)

## 2023-10-09 LAB — VITAMIN B12: Vitamin B-12: 439 pg/mL (ref 211–911)

## 2023-10-09 NOTE — Addendum Note (Signed)
Addended by: Kern Reap B on: 10/09/2023 03:59 PM   Modules accepted: Orders

## 2023-10-09 NOTE — Progress Notes (Signed)
Established Patient Office Visit     CC/Reason for Visit: Annual preventive exam  HPI: [name] is a 47 y.o. female who is coming in today for the above mentioned reasons. Past Medical History is significant for: Obesity, GERD, ADHD and vitamin D deficiency.  No major concerns or complaints.  Has routine eye and dental care.  Is due for flu, Tdap, COVID vaccinations.  All cancer screening is up-to-date.   Past Medical/Surgical History: Past Medical History:  Diagnosis Date   ADD (attention deficit disorder)    Breast mass, right 04/2018   GERD (gastroesophageal reflux disease)    Hypertension    Seborrheic dermatitis    Vitamin D deficiency     Past Surgical History:  Procedure Laterality Date   BREAST LUMPECTOMY WITH RADIOACTIVE SEED LOCALIZATION Right 04/25/2018   Procedure: RIGHT BREAST LUMPECTOMY WITH RADIOACTIVE SEED LOCALIZATION ERAS PATHWAY;  Surgeon: Harriette Bouillon, MD;  Location: [facility];  Service: General;  Laterality: Right;   CESAREAN SECTION  10/15/2006   CESAREAN SECTION  09/17/2008   CESAREAN SECTION     3 total   DILATION AND EVACUATION  12/10/2005   TONSILLECTOMY      Social History:  reports that she has never smoked. She has never used smokeless tobacco. She reports current alcohol use. She reports that she does not use drugs.  Allergies: No Known Allergies  Family History:  Family History  Problem Relation Age of Onset   Diabetes Mother    High blood pressure Mother    Cancer Mother    Depression Mother    Anxiety disorder Mother    Bipolar disorder Mother    Drug abuse Mother    Obesity Mother    Alcoholism Father    Drug abuse Father    Obesity Father    Colon cancer Neg Hx    Esophageal cancer Neg Hx    Stomach cancer Neg Hx    Rectal cancer Neg Hx      Current Outpatient Medications:    Cholecalciferol (VITAMIN D3) 50 MCG (2000 UT) capsule, Take 1 capsule (2,000 Units total) by mouth daily., Disp:  100 capsule, Rfl: 3   clobetasol cream (TEMOVATE) 0.05 %, Apply small amount to affected area nightly for 4 weeks then every other night for 4 weeks then twice a week for 4 weeks, Disp: 30 g, Rfl: 3   hydrOXYzine (ATARAX) 25 MG tablet, Take 1 tablet (25 mg total) by mouth at bedtime., Disp: 30 tablet, Rfl: 1   IUD'S IU, Place 1 Intra Uterine Device vaginally once., Disp: , Rfl:    Quercetin 50 MG TABS, Take by mouth., Disp: , Rfl:    Roflumilast, Antiseborrheic, (ZORYVE) 0.3 % FOAM, Apply 1 ampule topically daily., Disp: 60 g, Rfl: 2   tretinoin (RETIN-A) 0.025 % cream, Apply a small amount to face every night, Disp: 20 g, Rfl: 3  Review of Systems:  Negative unless indicated in HPI.   Physical Exam: Vitals:   10/09/23 1407  BP: 130/88  Pulse: 88  Temp: 98.1 F (36.7 C)  TempSrc: Oral  SpO2: 99%  Weight: 182 lb 6.4 oz (82.7 kg)  Height: 5' 0.5" (1.537 m)    Body mass index is 35.04 kg/m.   Physical Exam Vitals reviewed.  Constitutional:      General: She is not in acute distress.    Appearance: Normal appearance. She is not ill-appearing, toxic-appearing or diaphoretic.  HENT:     Head:  Normocephalic.     Right Ear: Tympanic membrane, ear canal and external ear normal. There is no impacted cerumen.     Left Ear: Tympanic membrane, ear canal and external ear normal. There is no impacted cerumen.     Nose: Nose normal.     Mouth/Throat:     Mouth: Mucous membranes are moist.     Pharynx: Oropharynx is clear. No oropharyngeal exudate or posterior oropharyngeal erythema.  Eyes:     General: No scleral icterus.       Right eye: No discharge.        Left eye: No discharge.     Conjunctiva/sclera: Conjunctivae normal.     Pupils: Pupils are equal, round, and reactive to light.  Neck:     Vascular: No carotid bruit.  Cardiovascular:     Rate and Rhythm: Normal rate and regular rhythm.     Pulses: Normal pulses.     Heart sounds: Normal heart sounds.  Pulmonary:      Effort: Pulmonary effort is normal. No respiratory distress.     Breath sounds: Normal breath sounds.  Abdominal:     General: Abdomen is flat. Bowel sounds are normal.     Palpations: Abdomen is soft.  Musculoskeletal:        General: Normal range of motion.     Cervical back: Normal range of motion.  Skin:    General: Skin is warm and dry.  Neurological:     General: No focal deficit present.     Mental Status: She is alert and oriented to person, place, and time. Mental status is at baseline.  Psychiatric:        Mood and Affect: Mood normal.        Behavior: Behavior normal.        Thought Content: Thought content normal.        Judgment: Judgment normal.     Impression and Plan:  Encounter for preventive health examination  Elevated BP without diagnosis of hypertension  Vitamin D insufficiency -     VITAMIN D 25 Hydroxy (Vit-D Deficiency, Fractures); Future  Morbid obesity (HCC) -     CBC with Differential/Platelet; Future -     Comprehensive metabolic panel; Future -     Lipid panel; Future -     TSH; Future -     Vitamin B12; Future  Immunization due  Encounter for hepatitis C screening test for low risk patient -     Hepatitis C antibody; Future  Encounter for screening for HIV -     HIV Antibody (routine testing w rflx); Future  -Recommend routine eye and dental care. -Healthy lifestyle discussed in detail. -Labs to be updated today. -Prostate cancer screening: N/A Health Maintenance  Topic Date Due   HIV Screening  Never done   Hepatitis C Screening  Never done   DTaP/Tdap/Td vaccine (1 - Tdap) Never done   Pap with HPV screening  Never done   COVID-19 Vaccine (3 - Pfizer risk series) 12/28/2020   Flu Shot  07/06/2023   Colon Cancer Screening  02/21/2033   HPV Vaccine  Aged Out     -Flu vaccine in office today, she will update COVID at a later date. -Blood pressure is elevated today, no diagnosis of hypertension.  She will do ambulatory blood  pressure measurements, work on lifestyle changes and return for follow-up in 3 months.    Chaya Jan, MD [facility]

## 2023-10-10 LAB — HEPATITIS C ANTIBODY: Hepatitis C Ab: NONREACTIVE

## 2023-10-10 LAB — HIV ANTIBODY (ROUTINE TESTING W REFLEX): HIV 1&2 Ab, 4th Generation: NONREACTIVE

## 2023-10-12 ENCOUNTER — Other Ambulatory Visit (HOSPITAL_COMMUNITY): Payer: Self-pay

## 2023-10-12 ENCOUNTER — Other Ambulatory Visit (HOSPITAL_BASED_OUTPATIENT_CLINIC_OR_DEPARTMENT_OTHER): Payer: Self-pay

## 2023-10-12 ENCOUNTER — Other Ambulatory Visit: Payer: Self-pay | Admitting: Internal Medicine

## 2023-10-12 DIAGNOSIS — E559 Vitamin D deficiency, unspecified: Secondary | ICD-10-CM

## 2023-10-12 MED ORDER — VITAMIN D (ERGOCALCIFEROL) 1.25 MG (50000 UNIT) PO CAPS
50000.0000 [IU] | ORAL_CAPSULE | ORAL | 0 refills | Status: AC
  Filled 2023-10-12 (×2): qty 4, 28d supply, fill #0
  Filled 2023-11-09 – 2023-11-13 (×2): qty 4, 28d supply, fill #1
  Filled 2023-12-07: qty 4, 28d supply, fill #2

## 2023-10-17 ENCOUNTER — Other Ambulatory Visit: Payer: Self-pay | Admitting: *Deleted

## 2023-10-17 DIAGNOSIS — E559 Vitamin D deficiency, unspecified: Secondary | ICD-10-CM

## 2023-11-13 ENCOUNTER — Other Ambulatory Visit (HOSPITAL_BASED_OUTPATIENT_CLINIC_OR_DEPARTMENT_OTHER): Payer: Self-pay

## 2023-11-15 ENCOUNTER — Other Ambulatory Visit: Payer: Self-pay

## 2024-01-22 ENCOUNTER — Other Ambulatory Visit: Payer: 59

## 2024-08-27 LAB — HM PAP SMEAR: HM Pap smear: NEGATIVE

## 2024-09-25 LAB — HM MAMMOGRAPHY

## 2024-11-06 NOTE — Telephone Encounter (Signed)
 The photos appears to be an inflamed cyst.  This is a new issue that goes beyond refilling a rx for an established problem.  Unfortunately my schedule does not have any last minute appointment slots for non-emergent issues. She can apply warm compress or present to her PCP or UC for oral antibiotics if it worsens (fever, chills, drainage)

## 2024-11-07 ENCOUNTER — Ambulatory Visit: Admitting: Dermatology

## 2024-11-07 ENCOUNTER — Encounter: Payer: Self-pay | Admitting: Dermatology

## 2024-11-07 VITALS — BP 145/96

## 2024-11-07 DIAGNOSIS — L720 Epidermal cyst: Secondary | ICD-10-CM | POA: Diagnosis not present

## 2024-11-07 DIAGNOSIS — L723 Sebaceous cyst: Secondary | ICD-10-CM

## 2024-11-07 MED ORDER — DOXYCYCLINE HYCLATE 100 MG PO CAPS: 100.0000 mg | ORAL_CAPSULE | Freq: Two times a day (BID) | ORAL | 0 refills | Status: AC

## 2024-11-07 MED ORDER — MUPIROCIN CALCIUM 2 % EX CREA: 1.0000 | TOPICAL_CREAM | Freq: Two times a day (BID) | CUTANEOUS | 0 refills | Status: AC

## 2024-11-07 NOTE — Progress Notes (Unsigned)
   Follow-Up Visit   Subjective  [name] is a 48 y.o. female who presents for the following: Spot on abdomen that came up about 3 weeks and sometimes is very painful. The pain waxes and wanes with what clothing she wears.   The following portions of the chart were reviewed this encounter and updated as appropriate: medications, allergies, medical history  Review of Systems:  No other skin or systemic complaints except as noted in HPI or Assessment and Plan.  Objective  Well appearing patient in no apparent distress; mood and affect are within normal limits.   A focused examination was performed of the following areas: Abdomen  Relevant exam findings are noted in the Assessment and Plan.    Right Abdomen (side) - Lower 2.0 cm cystic nodule   Assessment & Plan   Inflamed epidermoid cyst of abdomen Inflamed and tender cystic nodule on the abdomen, measuring 1-2 cm, present for approximately three weeks. Pain is intermittent and exacerbated by friction from clothing. No overt signs of infection, but risk of progression to abscess exists. Cysts are benign but can become uncomfortable and irritated. Doxycycline  chosen for its good skin penetration and anti-inflammatory properties. Mupirocin  ointment prescribed for topical application to prevent infection and reduce friction irritation.  - Prescribed doxycycline  100 mg twice daily for 10 days. - Advised taking doxycycline  with a substantial meal to minimize nausea. - Prescribed topical mupirocin  ointment for application on the cyst. - Instructed to cover the cyst with a Band-Aid after applying mupirocin  to reduce friction. - Advised to monitor for improvement every three days and contact the clinic if symptoms worsen.  INFLAMED EPIDERMOID CYST OF SKIN Right Abdomen (side) - Lower Start Doxycycline  100 mg 1 tablet twice daily with food and plenty of fluid x 10 days  Mupirocin  ointment Apply twice daily until  healed doxycycline  (VIBRAMYCIN ) 100 MG capsule - Right Abdomen (side) - Lower Take 1 capsule (100 mg total) by mouth 2 (two) times daily. Take with food and plenty of fluid  Return if symptoms worsen or fail to improve.  I, Roseline Hutchinson, CMA, am acting as scribe for [employer], DO .   Documentation: I have reviewed the above documentation for accuracy and completeness, and I agree with the above.  Delon Lenis, DO

## 2024-11-07 NOTE — Patient Instructions (Addendum)
 VISIT SUMMARY:  Today, you were seen for a new cyst on your abdomen that has been causing you pain, especially when your clothing rubs against it. You mentioned that the pain was severe earlier this week but has since lessened. You have a history of vaginal cysts, but this one feels different to you. We discussed your concerns about the cyst potentially worsening.  YOUR PLAN:  -INFLAMED EPIDERMOID CYST OF ABDOMEN:  An inflamed epidermoid cyst is a small, non-cancerous bump beneath the skin that can become irritated and painful.   We have prescribed doxycycline, an antibiotic that also helps reduce inflammation, to be taken 100 mg twice daily for 10 days.   To minimize nausea, take it with a substantial meal.   Additionally, you should apply mupirocin ointment to the cyst and cover it with a Band-Aid to prevent infection and reduce friction. Please monitor the cyst every three days and contact us  if it gets worse.  INSTRUCTIONS:  Please follow the medication instructions carefully and monitor the cyst every three days. If the symptoms worsen, contact our clinic immediately.    Doxycycline should be taken with food to prevent nausea. Do not lay down for 30 minutes after taking. Be cautious with sun exposure and use good sun protection while on this medication. Pregnant women should not take this medication.       Important Information  Due to recent changes in healthcare laws, you may see results of your pathology and/or laboratory studies on MyChart before the doctors have had a chance to review them. We understand that in some cases there may be results that are confusing or concerning to you. Please understand that not all results are received at the same time and often the doctors may need to interpret multiple results in order to provide you with the best plan of care or course of treatment. Therefore, we ask that you please give us  2 business days to thoroughly review all your  results before contacting the office for clarification. Should we see a critical lab result, you will be contacted sooner.   If You Need Anything After Your Visit  If you have any questions or concerns for your doctor, please call our main line at [phone] If no one answers, please leave a voicemail as directed and we will return your call as soon as possible. Messages left after 4 pm will be answered the following business day.   You may also send us  a message via MyChart. We typically respond to MyChart messages within 1-2 business days.  For prescription refills, please ask your pharmacy to contact our office. Our fax number is [phone].  If you have an urgent issue when the clinic is closed that cannot wait until the next business day, you can page your doctor at the number below.    Please note that while we do our best to be available for urgent issues outside of office hours, we are not available 24/7.   If you have an urgent issue and are unable to reach us , you may choose to seek medical care at your doctor's office, retail clinic, urgent care center, or emergency room.  If you have a medical emergency, please immediately call 911 or go to the emergency department. In the event of inclement weather, please call our main line at [phone] for an update on the status of any delays or closures.  Dermatology Medication Tips: Please keep the boxes that topical medications come in in order to help  keep track of the instructions about where and how to use these. Pharmacies typically print the medication instructions only on the boxes and not directly on the medication tubes.   If your medication is too expensive, please contact our office at [phone] or send us  a message through MyChart.   We are unable to tell what your co-pay for medications will be in advance as this is different depending on your insurance coverage. However, we may be able to find a substitute medication  at lower cost or fill out paperwork to get insurance to cover a needed medication.   If a prior authorization is required to get your medication covered by your insurance company, please allow us  1-2 business days to complete this process.  Drug prices often vary depending on where the prescription is filled and some pharmacies may offer cheaper prices.  The website www.goodrx.com contains coupons for medications through different pharmacies. The prices here do not account for what the cost may be with help from insurance (it may be cheaper with your insurance), but the website can give you the price if you did not use any insurance.  - You can print the associated coupon and take it with your prescription to the pharmacy.  - You may also stop by our office during regular business hours and pick up a GoodRx coupon card.  - If you need your prescription sent electronically to a different pharmacy, notify our office through [facility] or by phone at [phone]

## 2024-12-02 ENCOUNTER — Ambulatory Visit: Admitting: Internal Medicine

## 2024-12-02 ENCOUNTER — Encounter: Payer: Self-pay | Admitting: Internal Medicine

## 2024-12-02 VITALS — BP 134/98 | HR 78 | Temp 98.2°F | Ht 60.46 in | Wt 191.1 lb

## 2024-12-02 DIAGNOSIS — I10 Essential (primary) hypertension: Secondary | ICD-10-CM | POA: Insufficient documentation

## 2024-12-02 DIAGNOSIS — E559 Vitamin D deficiency, unspecified: Secondary | ICD-10-CM | POA: Diagnosis not present

## 2024-12-02 DIAGNOSIS — Z0000 Encounter for general adult medical examination without abnormal findings: Secondary | ICD-10-CM

## 2024-12-02 DIAGNOSIS — Z23 Encounter for immunization: Secondary | ICD-10-CM | POA: Diagnosis not present

## 2024-12-02 LAB — VITAMIN B12: Vitamin B-12: 1164 pg/mL — ABNORMAL HIGH (ref 211–911)

## 2024-12-02 LAB — COMPREHENSIVE METABOLIC PANEL WITH GFR
ALT: 20 U/L (ref 3–35)
AST: 17 U/L (ref 5–37)
Albumin: 4.6 g/dL (ref 3.5–5.2)
Alkaline Phosphatase: 63 U/L (ref 39–117)
BUN: 12 mg/dL (ref 6–23)
CO2: 31 meq/L (ref 19–32)
Calcium: 10 mg/dL (ref 8.4–10.5)
Chloride: 103 meq/L (ref 96–112)
Creatinine, Ser: 0.79 mg/dL (ref 0.40–1.20)
GFR: 89.65 mL/min
Glucose, Bld: 100 mg/dL — ABNORMAL HIGH (ref 70–99)
Potassium: 3.9 meq/L (ref 3.5–5.1)
Sodium: 142 meq/L (ref 135–145)
Total Bilirubin: 0.4 mg/dL (ref 0.2–1.2)
Total Protein: 7.5 g/dL (ref 6.0–8.3)

## 2024-12-02 LAB — LIPID PANEL
Cholesterol: 187 mg/dL (ref 28–200)
HDL: 56.4 mg/dL
LDL Cholesterol: 113 mg/dL — ABNORMAL HIGH (ref 10–99)
NonHDL: 130.62
Total CHOL/HDL Ratio: 3
Triglycerides: 86 mg/dL (ref 10.0–149.0)
VLDL: 17.2 mg/dL (ref 0.0–40.0)

## 2024-12-02 LAB — CBC WITH DIFFERENTIAL/PLATELET
Basophils Absolute: 0 K/uL (ref 0.0–0.1)
Basophils Relative: 0.5 % (ref 0.0–3.0)
Eosinophils Absolute: 0.1 K/uL (ref 0.0–0.7)
Eosinophils Relative: 1 % (ref 0.0–5.0)
HCT: 42.4 % (ref 36.0–46.0)
Hemoglobin: 13.9 g/dL (ref 12.0–15.0)
Lymphocytes Relative: 21.6 % (ref 12.0–46.0)
Lymphs Abs: 1.3 K/uL (ref 0.7–4.0)
MCHC: 32.7 g/dL (ref 30.0–36.0)
MCV: 85 fl (ref 78.0–100.0)
Monocytes Absolute: 0.4 K/uL (ref 0.1–1.0)
Monocytes Relative: 5.7 % (ref 3.0–12.0)
Neutro Abs: 4.4 K/uL (ref 1.4–7.7)
Neutrophils Relative %: 71.2 % (ref 43.0–77.0)
Platelets: 269 K/uL (ref 150.0–400.0)
RBC: 4.99 Mil/uL (ref 3.87–5.11)
RDW: 14.5 % (ref 11.5–15.5)
WBC: 6.2 K/uL (ref 4.0–10.5)

## 2024-12-02 LAB — VITAMIN D 25 HYDROXY (VIT D DEFICIENCY, FRACTURES): VITD: 16.29 ng/mL — ABNORMAL LOW (ref 30.00–100.00)

## 2024-12-02 LAB — HEMOGLOBIN A1C: Hgb A1c MFr Bld: 5.6 % (ref 4.6–6.5)

## 2024-12-02 LAB — TSH: TSH: 1.79 u[IU]/mL (ref 0.35–5.50)

## 2024-12-02 MED ORDER — AMLODIPINE BESYLATE 5 MG PO TABS: 5.0000 mg | ORAL_TABLET | Freq: Every day | ORAL | 1 refills | Status: AC

## 2024-12-02 NOTE — Addendum Note (Signed)
 Addended by: THEOPHILUS DELMA TULLY CINDERELLA on: 12/02/2024 09:57 AM   Modules accepted: Orders

## 2024-12-02 NOTE — Addendum Note (Signed)
 Addended by: Caylin Nass on: 12/02/2024 09:57 AM   Modules accepted: Orders

## 2024-12-02 NOTE — Progress Notes (Addendum)
 "    Established Patient Office Visit     CC/Reason for Visit: Annual preventive exam, discuss acute concerns  HPI: [name] is a 48 y.o. female who is coming in today for the above mentioned reasons. Past Medical History is significant for: GERD, vitamin D  deficiency, morbid obesity.  Has noted blood pressure to be elevated throughout the year.  Due for flu and Tdap vaccinations.   Past Medical/Surgical History: Past Medical History:  Diagnosis Date   ADD (attention deficit disorder)    Breast mass, right 04/2018   GERD (gastroesophageal reflux disease)    Hypertension    Seborrheic dermatitis    Vitamin D  deficiency     Past Surgical History:  Procedure Laterality Date   BREAST LUMPECTOMY WITH RADIOACTIVE SEED LOCALIZATION Right 04/25/2018   Procedure: RIGHT BREAST LUMPECTOMY WITH RADIOACTIVE SEED LOCALIZATION ERAS PATHWAY;  Surgeon: Vanderbilt Ned, MD;  Location: [facility];  Service: General;  Laterality: Right;   CESAREAN SECTION  10/15/2006   CESAREAN SECTION  09/17/2008   CESAREAN SECTION     3 total   DILATION AND EVACUATION  12/10/2005   TONSILLECTOMY      Social History:  reports that she has never smoked. She has never used smokeless tobacco. She reports current alcohol use. She reports that she does not use drugs.  Allergies: Allergies[1]  Family History:  Family History  Problem Relation Age of Onset   Diabetes Mother    High blood pressure Mother    Cancer Mother    Depression Mother    Anxiety disorder Mother    Bipolar disorder Mother    Drug abuse Mother    Obesity Mother    Alcoholism Father    Drug abuse Father    Obesity Father    Colon cancer Neg Hx    Esophageal cancer Neg Hx    Stomach cancer Neg Hx    Rectal cancer Neg Hx     Current Medications[2]  Review of Systems:  Negative unless indicated in HPI.   Physical Exam: Vitals:   12/02/24 0917  BP: (!) 134/98  Pulse: 78  Temp: 98.2 F (36.8 C)   TempSrc: Oral  SpO2: 97%  Weight: 191 lb 1.6 oz (86.7 kg)  Height: 5' 0.46 (1.536 m)    Body mass index is 36.75 kg/m.   Physical Exam Vitals reviewed.  Constitutional:      General: She is not in acute distress.    Appearance: Normal appearance. She is obese. She is not ill-appearing, toxic-appearing or diaphoretic.  HENT:     Head: Normocephalic.     Right Ear: Tympanic membrane, ear canal and external ear normal. There is no impacted cerumen.     Left Ear: Tympanic membrane, ear canal and external ear normal. There is no impacted cerumen.     Nose: Nose normal.     Mouth/Throat:     Mouth: Mucous membranes are moist.     Pharynx: Oropharynx is clear. No oropharyngeal exudate or posterior oropharyngeal erythema.  Eyes:     General: No scleral icterus.       Right eye: No discharge.        Left eye: No discharge.     Conjunctiva/sclera: Conjunctivae normal.     Pupils: Pupils are equal, round, and reactive to light.  Neck:     Vascular: No carotid bruit.  Cardiovascular:     Rate and Rhythm: Normal rate and regular rhythm.     Pulses:  Normal pulses.     Heart sounds: Normal heart sounds.  Pulmonary:     Effort: Pulmonary effort is normal. No respiratory distress.     Breath sounds: Normal breath sounds.  Abdominal:     General: Abdomen is flat. Bowel sounds are normal.     Palpations: Abdomen is soft.  Musculoskeletal:        General: Normal range of motion.     Cervical back: Normal range of motion.  Skin:    General: Skin is warm and dry.  Neurological:     General: No focal deficit present.     Mental Status: She is alert and oriented to person, place, and time. Mental status is at baseline.  Psychiatric:        Mood and Affect: Mood normal.        Behavior: Behavior normal.        Thought Content: Thought content normal.        Judgment: Judgment normal.      Impression and Plan:  Encounter for preventive health examination  Morbid obesity (HCC) -      Hemoglobin A1c; Future -     Lipid panel; Future -     TSH; Future -     Vitamin B12; Future -     VITAMIN D  25 Hydroxy (Vit-D Deficiency, Fractures); Future  Vitamin D  insufficiency  Primary hypertension -     amLODIPine Besylate; Take 1 tablet (5 mg total) by mouth daily.  Dispense: 90 tablet; Refill: 1 -     CBC with Differential/Platelet; Future -     Comprehensive metabolic panel with GFR; Future  Immunization due -     Flu vaccine trivalent PF, 6mos and older(Flulaval,Afluria,Fluarix,Fluzone) -     Tdap vaccine greater than or equal to 7yo IM   -Recommend routine eye and dental care. -Healthy lifestyle discussed in detail. -Labs to be updated today. -Prostate cancer screening: N/A Health Maintenance  Topic Date Due   COVID-19 Vaccine (3 - Pfizer risk series) 12/18/2024*   Pap with HPV screening  03/02/2025*   Breast Cancer Screening  06/02/2025*   Hepatitis B Vaccine (1 of 3 - 19+ 3-dose series) 12/02/2025*   Colon Cancer Screening  02/21/2033   DTaP/Tdap/Td vaccine (2 - Td or Tdap) 12/02/2034   Flu Shot  Completed   Hepatitis C Screening  Completed   HIV Screening  Completed   Pneumococcal Vaccine  Aged Out   HPV Vaccine  Aged Out   Meningitis B Vaccine  Aged Out  *Topic was postponed. The date shown is not the original due date.     - Influenza and Tdap vaccines in office today. - Start amlodipine 5 mg daily for blood pressure, return in 3 months for follow-up.    Susan Theophilus Andrews, MD [facility]     [1] No Known Allergies [2]  Current Outpatient Medications:    amLODipine (NORVASC) 5 MG tablet, Take 1 tablet (5 mg total) by mouth daily., Disp: 90 tablet, Rfl: 1   clobetasol  cream (TEMOVATE ) 0.05 %, Apply small amount to affected area nightly for 4 weeks then every other night for 4 weeks then twice a week for 4 weeks, Disp: 30 g, Rfl: 3   IUD'S IU, Place 1 Intra Uterine Device vaginally once., Disp: , Rfl:    Multiple  Vitamin (MULTIVITAMIN ADULT PO), Take by mouth daily., Disp: , Rfl:    Cholecalciferol  (VITAMIN D3) 50 MCG (2000 UT) capsule, Take 1  capsule (2,000 Units total) by mouth daily. (Patient not taking: Reported on 12/02/2024), Disp: 100 capsule, Rfl: 3   doxycycline  (VIBRAMYCIN ) 100 MG capsule, Take 1 capsule (100 mg total) by mouth 2 (two) times daily. Take with food and plenty of fluid (Patient not taking: Reported on 12/02/2024), Disp: 20 capsule, Rfl: 0   hydrOXYzine  (ATARAX ) 25 MG tablet, Take 1 tablet (25 mg total) by mouth at bedtime. (Patient not taking: Reported on 12/02/2024), Disp: 30 tablet, Rfl: 1   mupirocin  cream (BACTROBAN ) 2 %, Apply 1 Application topically 2 (two) times daily. (Patient not taking: Reported on 12/02/2024), Disp: 15 g, Rfl: 0   Quercetin 50 MG TABS, Take by mouth. (Patient not taking: Reported on 12/02/2024), Disp: , Rfl:    Roflumilast , Antiseborrheic, (ZORYVE ) 0.3 % FOAM, Apply 1 ampule topically daily. (Patient not taking: Reported on 12/02/2024), Disp: 60 g, Rfl: 2   tretinoin  (RETIN-A ) 0.025 % cream, Apply a small amount to face every night (Patient not taking: Reported on 12/02/2024), Disp: 20 g, Rfl: 3  "

## 2024-12-17 ENCOUNTER — Other Ambulatory Visit (HOSPITAL_COMMUNITY): Payer: Self-pay

## 2024-12-17 ENCOUNTER — Ambulatory Visit: Payer: Self-pay | Admitting: Internal Medicine

## 2024-12-17 ENCOUNTER — Other Ambulatory Visit: Payer: Self-pay | Admitting: Internal Medicine

## 2024-12-17 DIAGNOSIS — E559 Vitamin D deficiency, unspecified: Secondary | ICD-10-CM

## 2024-12-17 MED ORDER — VITAMIN D (ERGOCALCIFEROL) 1.25 MG (50000 UNIT) PO CAPS
50000.0000 [IU] | ORAL_CAPSULE | ORAL | 0 refills | Status: DC
  Filled 2024-12-17: qty 4, 28d supply, fill #0

## 2024-12-17 MED ORDER — VITAMIN D (ERGOCALCIFEROL) 1.25 MG (50000 UNIT) PO CAPS: 50000.0000 [IU] | ORAL_CAPSULE | ORAL | 0 refills | Status: AC

## 2024-12-19 ENCOUNTER — Encounter: Payer: Self-pay | Admitting: Internal Medicine

## 2024-12-25 ENCOUNTER — Other Ambulatory Visit (HOSPITAL_BASED_OUTPATIENT_CLINIC_OR_DEPARTMENT_OTHER): Payer: Self-pay

## 2024-12-26 ENCOUNTER — Other Ambulatory Visit (HOSPITAL_COMMUNITY): Payer: Self-pay

## 2025-03-03 ENCOUNTER — Ambulatory Visit: Admitting: Internal Medicine

## 2025-03-12 ENCOUNTER — Ambulatory Visit: Admitting: Internal Medicine
